# Patient Record
Sex: Female | Born: 1955 | Race: Black or African American | Hispanic: No | Smoking: Former smoker
Health system: Southern US, Community
[De-identification: ages and names within clinical notes are randomized; demographics above are authoritative.]

## PROBLEM LIST (undated history)

## (undated) DIAGNOSIS — M2540 Effusion, unspecified joint: Secondary | ICD-10-CM

## (undated) DIAGNOSIS — M79606 Pain in leg, unspecified: Secondary | ICD-10-CM

## (undated) DIAGNOSIS — R278 Other lack of coordination: Secondary | ICD-10-CM

## (undated) DIAGNOSIS — M5136 Other intervertebral disc degeneration, lumbar region: Secondary | ICD-10-CM

## (undated) DIAGNOSIS — M1990 Unspecified osteoarthritis, unspecified site: Secondary | ICD-10-CM

## (undated) DIAGNOSIS — M438X9 Other specified deforming dorsopathies, site unspecified: Secondary | ICD-10-CM

## (undated) DIAGNOSIS — M2550 Pain in unspecified joint: Secondary | ICD-10-CM

## (undated) DIAGNOSIS — M542 Cervicalgia: Secondary | ICD-10-CM

## (undated) DIAGNOSIS — R2689 Other abnormalities of gait and mobility: Secondary | ICD-10-CM

## (undated) DIAGNOSIS — T7840XA Allergy, unspecified, initial encounter: Secondary | ICD-10-CM

## (undated) DIAGNOSIS — M549 Dorsalgia, unspecified: Secondary | ICD-10-CM

## (undated) DIAGNOSIS — I10 Essential (primary) hypertension: Secondary | ICD-10-CM

## (undated) DIAGNOSIS — K2970 Gastritis, unspecified, without bleeding: Secondary | ICD-10-CM

## (undated) DIAGNOSIS — E785 Hyperlipidemia, unspecified: Secondary | ICD-10-CM

## (undated) DIAGNOSIS — M51369 Other intervertebral disc degeneration, lumbar region without mention of lumbar back pain or lower extremity pain: Secondary | ICD-10-CM

## (undated) DIAGNOSIS — M5416 Radiculopathy, lumbar region: Secondary | ICD-10-CM

## (undated) DIAGNOSIS — R011 Cardiac murmur, unspecified: Secondary | ICD-10-CM

## (undated) DIAGNOSIS — M419 Scoliosis, unspecified: Secondary | ICD-10-CM

## (undated) DIAGNOSIS — R29898 Other symptoms and signs involving the musculoskeletal system: Secondary | ICD-10-CM

## (undated) DIAGNOSIS — M7989 Other specified soft tissue disorders: Secondary | ICD-10-CM

## (undated) DIAGNOSIS — H9319 Tinnitus, unspecified ear: Secondary | ICD-10-CM

## (undated) DIAGNOSIS — M79603 Pain in arm, unspecified: Secondary | ICD-10-CM

## (undated) HISTORY — DX: Other lack of coordination: R27.8

## (undated) HISTORY — DX: Radiculopathy, lumbar region: M54.16

## (undated) HISTORY — DX: Dorsalgia, unspecified: M54.9

## (undated) HISTORY — DX: Pain in leg, unspecified: M79.606

## (undated) HISTORY — DX: Tinnitus, unspecified ear: H93.19

## (undated) HISTORY — DX: Other specified soft tissue disorders: M79.89

## (undated) HISTORY — DX: Cardiac murmur, unspecified: R01.1

## (undated) HISTORY — DX: Other intervertebral disc degeneration, lumbar region without mention of lumbar back pain or lower extremity pain: M51.369

## (undated) HISTORY — DX: Other abnormalities of gait and mobility: R26.89

## (undated) HISTORY — DX: Other symptoms and signs involving the musculoskeletal system: R29.898

## (undated) HISTORY — DX: Pain in arm, unspecified: M79.603

## (undated) HISTORY — DX: Pain in unspecified joint: M25.50

## (undated) HISTORY — DX: Allergy, unspecified, initial encounter: T78.40XA

## (undated) HISTORY — DX: Other intervertebral disc degeneration, lumbar region: M51.36

## (undated) HISTORY — DX: Other specified deforming dorsopathies, site unspecified: M43.8X9

## (undated) HISTORY — DX: Scoliosis, unspecified: M41.9

## (undated) HISTORY — DX: Gastritis, unspecified, without bleeding: K29.70

## (undated) HISTORY — PX: NECK SURGERY: SHX720

## (undated) HISTORY — PX: BACK SURGERY: SHX140

## (undated) HISTORY — DX: Effusion, unspecified joint: M25.40

## (undated) HISTORY — DX: Cervicalgia: M54.2

---

## 1998-07-17 ENCOUNTER — Emergency Department (HOSPITAL_COMMUNITY): Admission: EM | Admit: 1998-07-17 | Discharge: 1998-07-17 | Payer: Self-pay | Admitting: Emergency Medicine

## 2000-12-29 ENCOUNTER — Emergency Department (HOSPITAL_COMMUNITY): Admission: EM | Admit: 2000-12-29 | Discharge: 2000-12-29 | Payer: Self-pay | Admitting: Emergency Medicine

## 2001-04-20 ENCOUNTER — Ambulatory Visit (HOSPITAL_COMMUNITY): Admission: RE | Admit: 2001-04-20 | Discharge: 2001-04-20 | Payer: Self-pay

## 2001-04-20 ENCOUNTER — Encounter: Payer: Self-pay | Admitting: Family Medicine

## 2002-03-09 ENCOUNTER — Emergency Department (HOSPITAL_COMMUNITY): Admission: EM | Admit: 2002-03-09 | Discharge: 2002-03-09 | Payer: Self-pay | Admitting: *Deleted

## 2002-03-09 ENCOUNTER — Encounter: Payer: Self-pay | Admitting: *Deleted

## 2002-06-20 ENCOUNTER — Emergency Department (HOSPITAL_COMMUNITY): Admission: EM | Admit: 2002-06-20 | Discharge: 2002-06-20 | Payer: Self-pay | Admitting: Emergency Medicine

## 2003-08-05 ENCOUNTER — Ambulatory Visit (HOSPITAL_COMMUNITY): Admission: RE | Admit: 2003-08-05 | Discharge: 2003-08-05 | Payer: Self-pay | Admitting: Family Medicine

## 2003-08-05 ENCOUNTER — Encounter: Payer: Self-pay | Admitting: Family Medicine

## 2003-11-24 ENCOUNTER — Ambulatory Visit (HOSPITAL_COMMUNITY): Admission: RE | Admit: 2003-11-24 | Discharge: 2003-11-24 | Payer: Self-pay | Admitting: Family Medicine

## 2004-02-20 ENCOUNTER — Ambulatory Visit (HOSPITAL_COMMUNITY): Admission: RE | Admit: 2004-02-20 | Discharge: 2004-02-20 | Payer: Self-pay | Admitting: Family Medicine

## 2004-03-05 ENCOUNTER — Ambulatory Visit (HOSPITAL_COMMUNITY): Admission: RE | Admit: 2004-03-05 | Discharge: 2004-03-05 | Payer: Self-pay | Admitting: Family Medicine

## 2004-09-12 ENCOUNTER — Emergency Department (HOSPITAL_COMMUNITY): Admission: EM | Admit: 2004-09-12 | Discharge: 2004-09-12 | Payer: Self-pay | Admitting: Emergency Medicine

## 2004-12-26 ENCOUNTER — Emergency Department (HOSPITAL_COMMUNITY): Admission: EM | Admit: 2004-12-26 | Discharge: 2004-12-26 | Payer: Self-pay | Admitting: Emergency Medicine

## 2004-12-27 ENCOUNTER — Ambulatory Visit: Payer: Self-pay | Admitting: Family Medicine

## 2005-01-05 ENCOUNTER — Ambulatory Visit (HOSPITAL_COMMUNITY): Admission: RE | Admit: 2005-01-05 | Discharge: 2005-01-05 | Payer: Self-pay | Admitting: Family Medicine

## 2005-01-13 ENCOUNTER — Ambulatory Visit: Payer: Self-pay | Admitting: Orthopedic Surgery

## 2005-01-18 ENCOUNTER — Emergency Department (HOSPITAL_COMMUNITY): Admission: EM | Admit: 2005-01-18 | Discharge: 2005-01-18 | Payer: Self-pay | Admitting: Emergency Medicine

## 2005-02-02 ENCOUNTER — Ambulatory Visit: Payer: Self-pay | Admitting: Family Medicine

## 2005-02-17 ENCOUNTER — Encounter: Admission: RE | Admit: 2005-02-17 | Discharge: 2005-02-17 | Payer: Self-pay | Admitting: Neurological Surgery

## 2005-03-09 ENCOUNTER — Ambulatory Visit: Payer: Self-pay | Admitting: Family Medicine

## 2005-03-14 ENCOUNTER — Ambulatory Visit (HOSPITAL_COMMUNITY): Admission: RE | Admit: 2005-03-14 | Discharge: 2005-03-14 | Payer: Self-pay | Admitting: Family Medicine

## 2005-04-06 ENCOUNTER — Ambulatory Visit: Payer: Self-pay | Admitting: Family Medicine

## 2005-05-11 ENCOUNTER — Ambulatory Visit: Payer: Self-pay | Admitting: Family Medicine

## 2005-06-01 ENCOUNTER — Ambulatory Visit: Payer: Self-pay | Admitting: Family Medicine

## 2005-06-11 ENCOUNTER — Emergency Department (HOSPITAL_COMMUNITY): Admission: EM | Admit: 2005-06-11 | Discharge: 2005-06-11 | Payer: Self-pay | Admitting: Family Medicine

## 2005-07-15 ENCOUNTER — Emergency Department (HOSPITAL_COMMUNITY): Admission: EM | Admit: 2005-07-15 | Discharge: 2005-07-15 | Payer: Self-pay | Admitting: Family Medicine

## 2005-08-10 ENCOUNTER — Ambulatory Visit: Payer: Self-pay | Admitting: Family Medicine

## 2005-08-18 ENCOUNTER — Ambulatory Visit (HOSPITAL_BASED_OUTPATIENT_CLINIC_OR_DEPARTMENT_OTHER): Admission: RE | Admit: 2005-08-18 | Discharge: 2005-08-18 | Payer: Self-pay | Admitting: Orthopedic Surgery

## 2005-08-18 ENCOUNTER — Ambulatory Visit (HOSPITAL_COMMUNITY): Admission: RE | Admit: 2005-08-18 | Discharge: 2005-08-18 | Payer: Self-pay | Admitting: Orthopedic Surgery

## 2005-08-26 ENCOUNTER — Encounter: Admission: RE | Admit: 2005-08-26 | Discharge: 2005-10-18 | Payer: Self-pay | Admitting: Orthopedic Surgery

## 2005-10-29 ENCOUNTER — Emergency Department (HOSPITAL_COMMUNITY): Admission: EM | Admit: 2005-10-29 | Discharge: 2005-10-29 | Payer: Self-pay | Admitting: Family Medicine

## 2006-01-11 ENCOUNTER — Ambulatory Visit (HOSPITAL_COMMUNITY): Admission: RE | Admit: 2006-01-11 | Discharge: 2006-01-11 | Payer: Self-pay | Admitting: Orthopaedic Surgery

## 2006-02-07 ENCOUNTER — Ambulatory Visit: Payer: Self-pay | Admitting: Family Medicine

## 2006-04-07 ENCOUNTER — Emergency Department (HOSPITAL_COMMUNITY): Admission: EM | Admit: 2006-04-07 | Discharge: 2006-04-07 | Payer: Self-pay | Admitting: Emergency Medicine

## 2006-06-12 ENCOUNTER — Encounter: Payer: Self-pay | Admitting: Family Medicine

## 2006-06-12 ENCOUNTER — Other Ambulatory Visit: Admission: RE | Admit: 2006-06-12 | Discharge: 2006-06-12 | Payer: Self-pay | Admitting: Family Medicine

## 2006-06-12 ENCOUNTER — Ambulatory Visit: Payer: Self-pay | Admitting: Family Medicine

## 2006-07-19 ENCOUNTER — Ambulatory Visit: Payer: Self-pay | Admitting: Family Medicine

## 2006-08-23 ENCOUNTER — Ambulatory Visit: Payer: Self-pay | Admitting: Family Medicine

## 2006-10-24 ENCOUNTER — Ambulatory Visit: Payer: Self-pay | Admitting: Family Medicine

## 2006-10-25 ENCOUNTER — Ambulatory Visit (HOSPITAL_COMMUNITY): Admission: RE | Admit: 2006-10-25 | Discharge: 2006-10-25 | Payer: Self-pay | Admitting: Family Medicine

## 2006-10-31 ENCOUNTER — Encounter (HOSPITAL_COMMUNITY): Admission: RE | Admit: 2006-10-31 | Discharge: 2006-11-20 | Payer: Self-pay | Admitting: Family Medicine

## 2006-11-27 ENCOUNTER — Ambulatory Visit: Payer: Self-pay | Admitting: Family Medicine

## 2006-11-30 ENCOUNTER — Ambulatory Visit (HOSPITAL_COMMUNITY): Admission: RE | Admit: 2006-11-30 | Discharge: 2006-11-30 | Payer: Self-pay | Admitting: Family Medicine

## 2006-12-28 ENCOUNTER — Ambulatory Visit: Payer: Self-pay | Admitting: Family Medicine

## 2007-01-04 ENCOUNTER — Encounter: Payer: Self-pay | Admitting: Family Medicine

## 2007-01-04 LAB — CONVERTED CEMR LAB
HDL: 56 mg/dL (ref >39)
LDL Cholesterol: 169 mg/dL — ABNORMAL HIGH (ref 0–99)
Total CHOL/HDL Ratio: 4.4

## 2007-02-08 ENCOUNTER — Ambulatory Visit: Payer: Self-pay | Admitting: Family Medicine

## 2007-02-27 ENCOUNTER — Ambulatory Visit: Payer: Self-pay | Admitting: Family Medicine

## 2007-04-15 ENCOUNTER — Emergency Department (HOSPITAL_COMMUNITY): Admission: EM | Admit: 2007-04-15 | Discharge: 2007-04-15 | Payer: Self-pay | Admitting: Emergency Medicine

## 2007-06-12 ENCOUNTER — Ambulatory Visit (HOSPITAL_COMMUNITY): Admission: RE | Admit: 2007-06-12 | Discharge: 2007-06-12 | Payer: Self-pay | Admitting: Orthopaedic Surgery

## 2007-06-14 ENCOUNTER — Ambulatory Visit: Payer: Self-pay | Admitting: Family Medicine

## 2007-06-14 ENCOUNTER — Other Ambulatory Visit: Admission: RE | Admit: 2007-06-14 | Discharge: 2007-06-14 | Payer: Self-pay | Admitting: Family Medicine

## 2007-06-14 ENCOUNTER — Encounter: Payer: Self-pay | Admitting: Family Medicine

## 2007-06-14 LAB — CONVERTED CEMR LAB
Albumin: 4.2 g/dL (ref 3.5–5.2)
Bilirubin, Direct: 0.1 mg/dL (ref 0.0–0.3)
Chlamydia, DNA Probe: NEGATIVE
GC Probe Amp, Genital: NEGATIVE
Total Bilirubin: 0.7 mg/dL (ref 0.3–1.2)

## 2007-06-15 ENCOUNTER — Encounter: Payer: Self-pay | Admitting: Family Medicine

## 2007-06-15 LAB — CONVERTED CEMR LAB
Candida species: NEGATIVE
Gardnerella vaginalis: POSITIVE — AB
Trichomonal Vaginitis: NEGATIVE

## 2007-07-17 ENCOUNTER — Ambulatory Visit: Payer: Self-pay | Admitting: Family Medicine

## 2007-10-02 ENCOUNTER — Ambulatory Visit: Payer: Self-pay | Admitting: Family Medicine

## 2007-10-26 ENCOUNTER — Ambulatory Visit: Payer: Self-pay | Admitting: Family Medicine

## 2007-12-11 ENCOUNTER — Encounter: Payer: Self-pay | Admitting: Family Medicine

## 2007-12-11 DIAGNOSIS — M549 Dorsalgia, unspecified: Secondary | ICD-10-CM | POA: Insufficient documentation

## 2007-12-11 DIAGNOSIS — F329 Major depressive disorder, single episode, unspecified: Secondary | ICD-10-CM | POA: Insufficient documentation

## 2007-12-19 ENCOUNTER — Ambulatory Visit: Payer: Self-pay | Admitting: Family Medicine

## 2008-01-31 ENCOUNTER — Encounter: Payer: Self-pay | Admitting: Family Medicine

## 2008-01-31 ENCOUNTER — Ambulatory Visit: Payer: Self-pay | Admitting: Family Medicine

## 2008-01-31 DIAGNOSIS — E785 Hyperlipidemia, unspecified: Secondary | ICD-10-CM | POA: Insufficient documentation

## 2008-01-31 DIAGNOSIS — J309 Allergic rhinitis, unspecified: Secondary | ICD-10-CM | POA: Insufficient documentation

## 2008-01-31 DIAGNOSIS — I10 Essential (primary) hypertension: Secondary | ICD-10-CM | POA: Insufficient documentation

## 2008-02-01 ENCOUNTER — Encounter: Payer: Self-pay | Admitting: Family Medicine

## 2008-02-01 LAB — CONVERTED CEMR LAB
GC Probe Amp, Genital: NEGATIVE
Gardnerella vaginalis: POSITIVE — AB

## 2008-02-05 ENCOUNTER — Ambulatory Visit (HOSPITAL_COMMUNITY): Admission: RE | Admit: 2008-02-05 | Discharge: 2008-02-05 | Payer: Self-pay | Admitting: Family Medicine

## 2008-02-11 ENCOUNTER — Ambulatory Visit: Payer: Self-pay | Admitting: Family Medicine

## 2008-03-13 ENCOUNTER — Ambulatory Visit: Payer: Self-pay | Admitting: Family Medicine

## 2008-04-03 ENCOUNTER — Ambulatory Visit: Payer: Self-pay | Admitting: Family Medicine

## 2008-04-03 LAB — CONVERTED CEMR LAB: Helicobacter Pylori Antibody-IgG: 2.3 — ABNORMAL HIGH

## 2008-04-10 ENCOUNTER — Ambulatory Visit (HOSPITAL_COMMUNITY): Admission: RE | Admit: 2008-04-10 | Discharge: 2008-04-10 | Payer: Self-pay | Admitting: Family Medicine

## 2008-04-11 ENCOUNTER — Ambulatory Visit (HOSPITAL_COMMUNITY): Admission: RE | Admit: 2008-04-11 | Discharge: 2008-04-11 | Payer: Self-pay | Admitting: Family Medicine

## 2008-04-25 ENCOUNTER — Encounter (HOSPITAL_COMMUNITY): Admission: RE | Admit: 2008-04-25 | Discharge: 2008-05-25 | Payer: Self-pay | Admitting: Family Medicine

## 2008-05-08 ENCOUNTER — Ambulatory Visit: Payer: Self-pay | Admitting: Family Medicine

## 2008-05-22 ENCOUNTER — Encounter: Admission: RE | Admit: 2008-05-22 | Discharge: 2008-05-22 | Payer: Self-pay | Admitting: Family Medicine

## 2008-07-09 ENCOUNTER — Encounter: Payer: Self-pay | Admitting: Family Medicine

## 2008-07-29 ENCOUNTER — Telehealth: Payer: Self-pay | Admitting: Family Medicine

## 2008-08-04 ENCOUNTER — Encounter: Payer: Self-pay | Admitting: Family Medicine

## 2008-09-08 ENCOUNTER — Ambulatory Visit: Payer: Self-pay | Admitting: Family Medicine

## 2008-10-13 ENCOUNTER — Encounter: Payer: Self-pay | Admitting: Family Medicine

## 2008-11-11 ENCOUNTER — Telehealth (INDEPENDENT_AMBULATORY_CARE_PROVIDER_SITE_OTHER): Payer: Self-pay | Admitting: Internal Medicine

## 2008-11-18 ENCOUNTER — Telehealth: Payer: Self-pay | Admitting: Family Medicine

## 2008-11-19 ENCOUNTER — Encounter: Payer: Self-pay | Admitting: Family Medicine

## 2008-11-19 ENCOUNTER — Telehealth: Payer: Self-pay | Admitting: Family Medicine

## 2008-12-10 ENCOUNTER — Ambulatory Visit: Payer: Self-pay | Admitting: Family Medicine

## 2008-12-10 DIAGNOSIS — R5383 Other fatigue: Secondary | ICD-10-CM | POA: Insufficient documentation

## 2008-12-10 DIAGNOSIS — R5381 Other malaise: Secondary | ICD-10-CM

## 2008-12-22 ENCOUNTER — Telehealth: Payer: Self-pay | Admitting: Family Medicine

## 2009-01-23 ENCOUNTER — Encounter (INDEPENDENT_AMBULATORY_CARE_PROVIDER_SITE_OTHER): Payer: Self-pay | Admitting: *Deleted

## 2009-02-12 ENCOUNTER — Ambulatory Visit: Payer: Self-pay | Admitting: Family Medicine

## 2009-02-12 LAB — CONVERTED CEMR LAB
Glucose, Urine, Semiquant: NEGATIVE
Nitrite: NEGATIVE
Specific Gravity, Urine: 1.015
pH: 8.5

## 2009-02-17 ENCOUNTER — Encounter: Payer: Self-pay | Admitting: Family Medicine

## 2009-02-19 ENCOUNTER — Encounter: Payer: Self-pay | Admitting: Family Medicine

## 2009-02-19 LAB — CONVERTED CEMR LAB
ALT: 10 units/L (ref 0–35)
Basophils Absolute: 0.1 10*3/uL (ref 0.0–0.1)
Chloride: 105 meq/L (ref 96–112)
Cholesterol: 249 mg/dL — ABNORMAL HIGH (ref 0–200)
Glucose, Bld: 92 mg/dL (ref 70–99)
Hemoglobin: 14.8 g/dL (ref 12.0–15.0)
Lymphocytes Relative: 50 % — ABNORMAL HIGH (ref 12–46)
Lymphs Abs: 3.2 10*3/uL (ref 0.7–4.0)
Monocytes Absolute: 0.5 10*3/uL (ref 0.1–1.0)
Neutro Abs: 2.5 10*3/uL (ref 1.7–7.7)
Potassium: 3.8 meq/L (ref 3.5–5.3)
RDW: 15.6 % — ABNORMAL HIGH (ref 11.5–15.5)
Sodium: 139 meq/L (ref 135–145)
TSH: 0.995 microintl units/mL (ref 0.350–4.500)
Total CHOL/HDL Ratio: 4.5
Total Protein: 8.5 g/dL — ABNORMAL HIGH (ref 6.0–8.3)
Triglycerides: 95 mg/dL (ref <150)
VLDL: 19 mg/dL (ref 0–40)
WBC: 6.5 10*3/uL (ref 4.0–10.5)

## 2009-03-17 IMAGING — NM NM HEPATO W/GB/PHARM/[PERSON_NAME]
2 series · 12 of 12 positions shown · non-contrast
Comparison: Ultrasound there is ultrasound 04/11/2008

CLINICAL DATA: Nominal pain and bloating

NUCLEAR MEDICINE HEPATOBILIARY IMAGING WITH GALLBLADDER EF
TECHNIQUE: Sequential images of the abdomen were obtained [DATE] minutes following intravenous administration of
radiopharmaceutical. After oral ingestion of 8oz of half and half
cream, gallbladder ejection fraction was determined.
Radiopharmaceutical:  Y.BmYi Oc-CCm Choletec

[Series 1: hepatobiliary · 3.20mm/px · 6 of 60 frames shown (1 of 2)]
[frame 6/60]
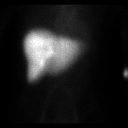
[frame 16/60]
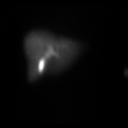
[frame 26/60]
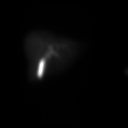
[frame 36/60]
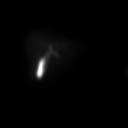
[frame 46/60]
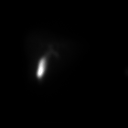
[frame 56/60]
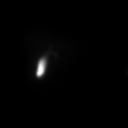

[Series 1: hepatobiliary · 3.20mm/px · 6 of 60 frames shown (2 of 2)]
[frame 6/60]
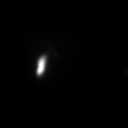
[frame 16/60]
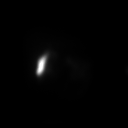
[frame 26/60]
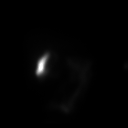
[frame 36/60]
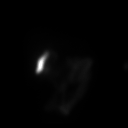
[frame 46/60]
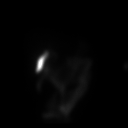
[frame 56/60]
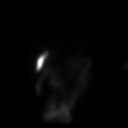

[12 of 12 positions shown; findings below may reference images not displayed]

FINDINGS: There is prompt extraction of radiotracer from the blood
pool homogeneous uptake in the liver.  The gallbladder begins to
fill by 15 minutes. At 60 minutes, a fatty meal was provided
orally.  The gallbladder contracted appropriately with a calculated
ejection fraction equal 63 % at 60 minutes (normal greater than
50%).
IMPRESSION: 1..  Normal gallbladder ejection fraction =  63%.
2..  Patent cystic and common bile duct.

## 2009-03-20 ENCOUNTER — Ambulatory Visit: Payer: Self-pay | Admitting: Gastroenterology

## 2009-03-20 DIAGNOSIS — K5900 Constipation, unspecified: Secondary | ICD-10-CM | POA: Insufficient documentation

## 2009-03-20 DIAGNOSIS — A048 Other specified bacterial intestinal infections: Secondary | ICD-10-CM | POA: Insufficient documentation

## 2009-03-23 ENCOUNTER — Telehealth: Payer: Self-pay | Admitting: Family Medicine

## 2009-03-23 ENCOUNTER — Encounter: Payer: Self-pay | Admitting: Gastroenterology

## 2009-03-30 ENCOUNTER — Ambulatory Visit: Payer: Self-pay | Admitting: Gastroenterology

## 2009-03-30 ENCOUNTER — Encounter: Payer: Self-pay | Admitting: Gastroenterology

## 2009-03-30 ENCOUNTER — Ambulatory Visit (HOSPITAL_COMMUNITY): Admission: RE | Admit: 2009-03-30 | Discharge: 2009-03-30 | Payer: Self-pay | Admitting: Gastroenterology

## 2009-03-30 HISTORY — PX: COLONOSCOPY: SHX5424

## 2009-04-01 ENCOUNTER — Encounter: Payer: Self-pay | Admitting: Gastroenterology

## 2009-04-09 ENCOUNTER — Telehealth: Payer: Self-pay | Admitting: Gastroenterology

## 2009-04-16 ENCOUNTER — Encounter (INDEPENDENT_AMBULATORY_CARE_PROVIDER_SITE_OTHER): Payer: Self-pay

## 2009-05-01 ENCOUNTER — Ambulatory Visit: Payer: Self-pay | Admitting: Gastroenterology

## 2009-05-01 DIAGNOSIS — K5730 Diverticulosis of large intestine without perforation or abscess without bleeding: Secondary | ICD-10-CM | POA: Insufficient documentation

## 2009-05-01 DIAGNOSIS — D126 Benign neoplasm of colon, unspecified: Secondary | ICD-10-CM | POA: Insufficient documentation

## 2009-05-07 ENCOUNTER — Encounter: Payer: Self-pay | Admitting: Gastroenterology

## 2009-05-07 DIAGNOSIS — R799 Abnormal finding of blood chemistry, unspecified: Secondary | ICD-10-CM | POA: Insufficient documentation

## 2009-05-07 LAB — CONVERTED CEMR LAB
Basophils Absolute: 0 10*3/uL (ref 0.0–0.1)
Basophils Relative: 0 % (ref 0–1)
Eosinophils Absolute: 0.2 10*3/uL (ref 0.0–0.7)
Eosinophils Relative: 2 % (ref 0–5)
HCT: 42.1 % (ref 36.0–46.0)
Hemoglobin, Urine: NEGATIVE
Hemoglobin: 13.8 g/dL (ref 12.0–15.0)
Ketones, ur: 15 mg/dL — AB
Leukocytes, UA: NEGATIVE
Lymphocytes Relative: 46 % (ref 12–46)
Lymphs Abs: 3.9 10*3/uL (ref 0.7–4.0)
MCHC: 32.8 g/dL (ref 30.0–36.0)
MCV: 91.1 fL (ref 78.0–100.0)
Monocytes Absolute: 0.5 10*3/uL (ref 0.1–1.0)
Monocytes Relative: 6 % (ref 3–12)
Neutro Abs: 3.9 10*3/uL (ref 1.7–7.7)
Neutrophils Relative %: 46 % (ref 43–77)
Nitrite: NEGATIVE
Platelets: 267 10*3/uL (ref 150–400)
Protein, ur: NEGATIVE mg/dL
RBC: 4.62 M/uL (ref 3.87–5.11)
RDW: 14.5 % (ref 11.5–15.5)
Specific Gravity, Urine: 1.022 (ref 1.005–1.030)
Urine Glucose: NEGATIVE mg/dL
Urobilinogen, UA: 1 (ref 0.0–1.0)
WBC: 8.6 10*3/uL (ref 4.0–10.5)
pH: 6 (ref 5.0–8.0)

## 2009-05-18 LAB — CONVERTED CEMR LAB
ALT: 8 units/L (ref 0–35)
AST: 11 units/L (ref 0–37)
Albumin: 3.7 g/dL (ref 3.5–5.2)
Alkaline Phosphatase: 65 units/L (ref 39–117)
BUN: 11 mg/dL (ref 6–23)
Bilirubin, Direct: <0.1 mg/dL (ref 0.0–0.3)
CO2: 25 meq/L (ref 19–32)
Calcium: 9 mg/dL (ref 8.4–10.5)
Chloride: 109 meq/L (ref 96–112)
Creatinine, Ser: 0.76 mg/dL (ref 0.40–1.20)
Glucose, Bld: 90 mg/dL (ref 70–99)
Potassium: 3.9 meq/L (ref 3.5–5.3)
Sodium: 143 meq/L (ref 135–145)
Total Bilirubin: 0.4 mg/dL (ref 0.3–1.2)
Total Protein: 7.1 g/dL (ref 6.0–8.3)

## 2009-06-04 ENCOUNTER — Encounter: Payer: Self-pay | Admitting: Gastroenterology

## 2009-06-22 ENCOUNTER — Telehealth: Payer: Self-pay | Admitting: Family Medicine

## 2009-06-23 ENCOUNTER — Ambulatory Visit: Payer: Self-pay | Admitting: Family Medicine

## 2009-06-24 ENCOUNTER — Encounter (INDEPENDENT_AMBULATORY_CARE_PROVIDER_SITE_OTHER): Payer: Self-pay | Admitting: *Deleted

## 2009-07-29 ENCOUNTER — Ambulatory Visit: Payer: Self-pay | Admitting: Gastroenterology

## 2009-07-30 ENCOUNTER — Encounter: Payer: Self-pay | Admitting: Family Medicine

## 2009-08-06 ENCOUNTER — Ambulatory Visit: Payer: Self-pay | Admitting: Family Medicine

## 2009-08-14 ENCOUNTER — Encounter: Payer: Self-pay | Admitting: Family Medicine

## 2009-08-14 ENCOUNTER — Ambulatory Visit (HOSPITAL_COMMUNITY): Admission: RE | Admit: 2009-08-14 | Discharge: 2009-08-15 | Payer: Self-pay | Admitting: Orthopaedic Surgery

## 2009-08-17 ENCOUNTER — Telehealth: Payer: Self-pay | Admitting: Family Medicine

## 2009-09-28 ENCOUNTER — Encounter: Payer: Self-pay | Admitting: Family Medicine

## 2009-10-19 ENCOUNTER — Ambulatory Visit: Payer: Self-pay | Admitting: Family Medicine

## 2009-11-10 ENCOUNTER — Encounter (INDEPENDENT_AMBULATORY_CARE_PROVIDER_SITE_OTHER): Payer: Self-pay | Admitting: *Deleted

## 2009-11-19 ENCOUNTER — Emergency Department (HOSPITAL_COMMUNITY): Admission: EM | Admit: 2009-11-19 | Discharge: 2009-11-19 | Payer: Self-pay | Admitting: Emergency Medicine

## 2009-11-27 ENCOUNTER — Encounter: Payer: Self-pay | Admitting: Family Medicine

## 2009-11-30 ENCOUNTER — Telehealth: Payer: Self-pay | Admitting: Family Medicine

## 2009-11-30 ENCOUNTER — Ambulatory Visit: Payer: Self-pay | Admitting: Family Medicine

## 2009-11-30 DIAGNOSIS — M810 Age-related osteoporosis without current pathological fracture: Secondary | ICD-10-CM | POA: Insufficient documentation

## 2009-12-09 ENCOUNTER — Encounter: Payer: Self-pay | Admitting: Family Medicine

## 2009-12-09 ENCOUNTER — Telehealth: Payer: Self-pay | Admitting: Family Medicine

## 2009-12-10 LAB — CONVERTED CEMR LAB
BUN: 11 mg/dL (ref 6–23)
Bilirubin, Direct: 0.1 mg/dL (ref 0.0–0.3)
Calcium: 10 mg/dL (ref 8.4–10.5)
Creatinine, Ser: 0.79 mg/dL (ref 0.40–1.20)
HDL: 55 mg/dL (ref >39)
LDL Cholesterol: 200 mg/dL — ABNORMAL HIGH (ref 0–99)
Potassium: 5.9 meq/L — ABNORMAL HIGH (ref 3.5–5.3)
Total Bilirubin: 0.7 mg/dL (ref 0.3–1.2)
Triglycerides: 127 mg/dL (ref <150)
VLDL: 25 mg/dL (ref 0–40)

## 2009-12-14 ENCOUNTER — Ambulatory Visit (HOSPITAL_COMMUNITY): Admission: RE | Admit: 2009-12-14 | Discharge: 2009-12-14 | Payer: Self-pay | Admitting: Family Medicine

## 2009-12-15 LAB — CONVERTED CEMR LAB: Potassium: 3.8 meq/L (ref 3.5–5.3)

## 2010-01-12 ENCOUNTER — Encounter (INDEPENDENT_AMBULATORY_CARE_PROVIDER_SITE_OTHER): Payer: Self-pay | Admitting: *Deleted

## 2010-01-13 ENCOUNTER — Telehealth: Payer: Self-pay | Admitting: Family Medicine

## 2010-01-14 ENCOUNTER — Ambulatory Visit: Payer: Self-pay | Admitting: Family Medicine

## 2010-02-01 ENCOUNTER — Telehealth: Payer: Self-pay | Admitting: Family Medicine

## 2010-02-15 ENCOUNTER — Ambulatory Visit: Payer: Self-pay | Admitting: Family Medicine

## 2010-02-15 DIAGNOSIS — M542 Cervicalgia: Secondary | ICD-10-CM | POA: Insufficient documentation

## 2010-02-18 ENCOUNTER — Ambulatory Visit: Payer: Self-pay | Admitting: Family Medicine

## 2010-03-01 ENCOUNTER — Ambulatory Visit: Payer: Self-pay | Admitting: Family Medicine

## 2010-03-01 ENCOUNTER — Other Ambulatory Visit: Admission: RE | Admit: 2010-03-01 | Discharge: 2010-03-01 | Payer: Self-pay | Admitting: Family Medicine

## 2010-03-01 LAB — CONVERTED CEMR LAB
OCCULT 1: NEGATIVE
Pap Smear: NEGATIVE

## 2010-03-15 ENCOUNTER — Telehealth: Payer: Self-pay | Admitting: Family Medicine

## 2010-03-18 ENCOUNTER — Encounter: Payer: Self-pay | Admitting: Family Medicine

## 2010-03-23 ENCOUNTER — Encounter: Payer: Self-pay | Admitting: Family Medicine

## 2010-04-08 ENCOUNTER — Ambulatory Visit: Payer: Self-pay | Admitting: Family Medicine

## 2010-04-13 ENCOUNTER — Telehealth: Payer: Self-pay | Admitting: Family Medicine

## 2010-04-13 ENCOUNTER — Encounter: Payer: Self-pay | Admitting: Family Medicine

## 2010-04-14 ENCOUNTER — Telehealth: Payer: Self-pay | Admitting: Family Medicine

## 2010-04-15 LAB — CONVERTED CEMR LAB
AST: 14 units/L (ref 0–37)
Albumin: 4 g/dL (ref 3.5–5.2)
CO2: 27 meq/L (ref 19–32)
Calcium: 9.6 mg/dL (ref 8.4–10.5)
Chloride: 107 meq/L (ref 96–112)
HDL: 53 mg/dL (ref >39)
Lymphs Abs: 3.2 10*3/uL (ref 0.7–4.0)
Monocytes Relative: 7 % (ref 3–12)
Neutro Abs: 3.9 10*3/uL (ref 1.7–7.7)
Neutrophils Relative %: 50 % (ref 43–77)
RBC: 4.44 M/uL (ref 3.87–5.11)
Sodium: 142 meq/L (ref 135–145)
TSH: 0.452 microintl units/mL (ref 0.350–4.500)
Total Bilirubin: 0.7 mg/dL (ref 0.3–1.2)
Total CHOL/HDL Ratio: 4.4
Total Protein: 7.1 g/dL (ref 6.0–8.3)
Triglycerides: 82 mg/dL (ref <150)
WBC: 7.8 10*3/uL (ref 4.0–10.5)

## 2010-06-14 ENCOUNTER — Telehealth: Payer: Self-pay | Admitting: Family Medicine

## 2010-07-13 ENCOUNTER — Ambulatory Visit: Payer: Self-pay | Admitting: Family Medicine

## 2010-07-15 ENCOUNTER — Telehealth: Payer: Self-pay | Admitting: Family Medicine

## 2010-08-30 ENCOUNTER — Ambulatory Visit: Payer: Self-pay | Admitting: Family Medicine

## 2010-09-15 ENCOUNTER — Telehealth: Payer: Self-pay | Admitting: Family Medicine

## 2010-09-21 ENCOUNTER — Telehealth: Payer: Self-pay | Admitting: Family Medicine

## 2010-09-28 ENCOUNTER — Telehealth: Payer: Self-pay | Admitting: Family Medicine

## 2010-10-05 ENCOUNTER — Emergency Department (HOSPITAL_COMMUNITY): Admission: EM | Admit: 2010-10-05 | Discharge: 2010-10-05 | Payer: Self-pay | Admitting: Emergency Medicine

## 2010-10-05 ENCOUNTER — Telehealth: Payer: Self-pay | Admitting: Family Medicine

## 2010-10-13 ENCOUNTER — Encounter: Payer: Self-pay | Admitting: Family Medicine

## 2010-10-22 ENCOUNTER — Encounter: Payer: Self-pay | Admitting: Family Medicine

## 2010-10-26 ENCOUNTER — Telehealth (INDEPENDENT_AMBULATORY_CARE_PROVIDER_SITE_OTHER): Payer: Self-pay | Admitting: *Deleted

## 2010-11-10 ENCOUNTER — Ambulatory Visit: Payer: Self-pay | Admitting: Family Medicine

## 2010-11-10 DIAGNOSIS — H6690 Otitis media, unspecified, unspecified ear: Secondary | ICD-10-CM | POA: Insufficient documentation

## 2010-12-03 ENCOUNTER — Encounter: Payer: Self-pay | Admitting: Family Medicine

## 2010-12-06 ENCOUNTER — Telehealth (INDEPENDENT_AMBULATORY_CARE_PROVIDER_SITE_OTHER): Payer: Self-pay | Admitting: *Deleted

## 2010-12-11 ENCOUNTER — Encounter: Payer: Self-pay | Admitting: Family Medicine

## 2010-12-12 ENCOUNTER — Encounter: Payer: Self-pay | Admitting: Family Medicine

## 2010-12-13 ENCOUNTER — Encounter: Payer: Self-pay | Admitting: Family Medicine

## 2010-12-21 NOTE — Letter (Signed)
Summary: ccme  ccme   Imported By: Lind Guest 03/18/2010 14:48:51  _____________________________________________________________________  External Attachment:    Type:   Image     Comment:   External Document

## 2010-12-21 NOTE — Progress Notes (Signed)
Summary: call  Phone Note Call from Patient   Summary of Call: she recieved your letter and wants you to call her back at [phone] or  [phone] Initial call taken by: Lind Guest,  Apr 14, 2010 10:06 AM  Follow-up for Phone Call        advised patient of lab results, sent refill requested Follow-up by: Adella Hare LPN,  Apr 15, 2010 2:24 PM    Prescriptions: VITAMIN D (ERGOCALCIFEROL) 50000 UNIT CAPS (ERGOCALCIFEROL) one tab by mouth every week  #4 x 3   Entered by:   Adella Hare LPN   Authorized by:   Syliva Overman MD   Signed by:   Adella Hare LPN on 63/87/5643   Method used:   Electronically to        [facility] (retail)       [address] [place], [place]  [address]       Ph: [phone]       Fax: [phone]   RxID:   [id number]

## 2010-12-21 NOTE — Letter (Signed)
Summary: Scheduled Appointment  [facility]  [address]   [place], [place] [address]   Phone: [phone]  Fax: [phone]    January 12, 2010   Dear: Lori Singh            DOB: 1956-01-06    I have been instructed to schedule you an appointment in our office.  Your appointment is as follows:   Date: February 03, 2010  Time: 4:00pm     Please be here 15 minutes early.   Provider: _Dr Darrick Penna    Please contact the office if you need to reschedule this appointment for a more convenient time.   Thank you,    [facility] Ph: [phone]   Fax: [phone]

## 2010-12-21 NOTE — Progress Notes (Signed)
Summary: [facility] BONE DENISTY  Phone Note Call from Patient   Summary of Call: [facility] HER BONE DENISTY AND WANTS TO WAIT ON HER MAMMOGRAM BECAUSE OF HER BACK Initial call taken by: Lind Guest,  December 09, 2009 4:02 PM  Follow-up for Phone Call        pls do and let her know Follow-up by: Syliva Overman MD,  December 09, 2009 5:12 PM  Additional Follow-up for Phone Call Additional follow up Details #1::        Luann rescheduled her bone density and it is for 12/14/2009. pt notified  Additional Follow-up by: Rudene Anda,  December 10, 2009 8:28 AM

## 2010-12-21 NOTE — Letter (Signed)
Summary: discharge letter  discharge letter   Imported By: Curtis Sites 10/22/2010 12:14:45  _____________________________________________________________________  External Attachment:    Type:   Image     Comment:   External Document

## 2010-12-21 NOTE — Progress Notes (Signed)
Summary: refills  Phone Note Call from Patient   Summary of Call: needs a refill on hydrocodine and allegry meds. taking mom to hospice today and would like to get xanax [phone] Initial call taken by: Rudene Anda,  June 14, 2010 10:14 AM  Follow-up for Phone Call        no allergy meds on file, others sent to pharmacy Follow-up by: Adella Hare LPN,  June 14, 2010 10:18 AM    Prescriptions: HYDROCODONE-ACETAMINOPHEN 10-325 MG TABS (HYDROCODONE-ACETAMINOPHEN) one tablet 4 times daily as needed for back pain.  Not to exceed 4 in 24 hrs.  #120 x 0   Entered by:   Adella Hare LPN   Authorized by:   Syliva Overman MD   Signed by:   Adella Hare LPN on 09/81/1914   Method used:   Printed then faxed to ...       [facility] (retail)       [address]       [place], [place]  [address]       Ph: [phone]       Fax: [phone]   RxID:   [id number] ALPRAZOLAM 0.25 MG TABS (ALPRAZOLAM) Take 1 tab by mouth at bedtime  #30 x 0   Entered by:   Adella Hare LPN   Authorized by:   Syliva Overman MD   Signed by:   Adella Hare LPN on 40/34/7425   Method used:   Printed then faxed to ...       [facility] (retail)       [address]       [place], [place]  [address]       Ph: [phone]       Fax: [phone]   RxID:   [id number]

## 2010-12-21 NOTE — Progress Notes (Signed)
Summary: RECEIVED LETTER  Phone Note Call from Patient   Summary of Call: SHE HAS RECEIVED HER LETTER ABOUT BEING DISCHARGED AND IS DISAPPOINTED BECAUSE SHE HAS BEEN WITH DR. Lodema Hong FOREVER AND DR. Lodema Hong UNDERSTANDS HER AND AT THIS POINT IN LIFE WOULD BE HARD TO FIND ANOTHER LIKE DR Ciro Backer CALL HER AT [phone] Initial call taken by: Lind Guest,  October 26, 2010 2:33 PM  Follow-up for Phone Call        Called Ms. Priest 10/26/10 @ 4:50 PM.  She asked if we would reconsider discharging her from the practice and I advised that was not possible.  She stated her son was in federal prison and she forgot to call but this was not the first NO SHOW for patient.  We did not discuss her episodes of using profanity with the nurse and the decision to discharge her based on Dr. Anthony Sar recommendations.  She asked that she be allowed to pick up her records and will come in to sign a ROI form and we will work with [employer] to make that happen.  Advisd there may be some expense involved.   Follow-up by: Doneen Poisson

## 2010-12-21 NOTE — Progress Notes (Signed)
Summary: RX  Phone Note Call from Patient   Summary of Call: NEEDS HER HYDROCODONE REFILLED EVERYTHING WAS READY BUT THAT 1   [place] [facility] Initial call taken by: Lind Guest,  July 15, 2010 11:01 AM    Prescriptions: HYDROCODONE-ACETAMINOPHEN 10-325 MG TABS (HYDROCODONE-ACETAMINOPHEN) one tablet 4 times daily as needed for back pain.  Not to exceed 4 in 24 hrs.  #120 x 1   Entered by:   Everitt Amber LPN   Authorized by:   Syliva Overman MD   Signed by:   Everitt Amber LPN on 52/84/1324   Method used:   Printed then faxed to ...       [facility] (retail)       [address]       [place], [place]  [address]       Ph: [phone]       Fax: [phone]   RxID:   [id number]

## 2010-12-21 NOTE — Progress Notes (Signed)
Summary: medicine /  cold  Phone Note Call from Patient   Summary of Call: runny nose  cough drain  fling that seems to be stuck on back of throat     no fever chills  wanst something called in [place] apot call back [phone] Initial call taken by: Lind Guest,  September 21, 2010 11:31 AM  Follow-up for Phone Call        advise meds not covered by ins, but but tess perles shoukld cost $4 Follow-up by: Syliva Overman MD,  September 21, 2010 12:49 PM  Additional Follow-up for Phone Call Additional follow up Details #1::        patient aware Additional Follow-up by: Adella Hare LPN,  September 21, 2010 1:45 PM    New/Updated Medications: TESSALON PERLES 100 MG CAPS (BENZONATATE) Take 1 capsule by mouth three times a day Prescriptions: TESSALON PERLES 100 MG CAPS (BENZONATATE) Take 1 capsule by mouth three times a day  #21 x 0   Entered and Authorized by:   Syliva Overman MD   Signed by:   Syliva Overman MD on 09/21/2010   Method used:   Electronically to        [facility] (retail)       [address]       [place], [place]  [address]       Ph: [phone]       Fax: [phone]   RxID:   [id number]

## 2010-12-21 NOTE — Assessment & Plan Note (Signed)
Summary: PHY   Vital Signs:  Patient profile:   55 year old female Menstrual status:  postmenopausal Height:      62 inches Weight:      115 pounds BMI:     21.11 O2 Sat:      98 % Pulse rate:   87 / minute Pulse rhythm:   regular Resp:     16 per minute BP sitting:   164 / 76  (left arm) Cuff size:   regular  Vitals Entered By: Everitt Amber LPN (March 01, 2010 1:43 PM) CC: 7 lb weight loss in a week since starting the Maxzide  Vision Screening:Right eye with correction: 20 / 25 Both eyes with correction: 20 / 20  Color vision testing: normal     Vision Comments: states she can't see good with her left eye due to cataracts  Vision Entered By: Everitt Amber LPN (March 01, 2010 1:46 PM)   Primary Care Provider:  Dr. Lodema Hong  CC:  7 lb weight loss in a week since starting the Maxzide.  History of Present Illness: Pt reports she hjas not been doing well. She is overwhelmed ioth her personal ealth, chronic back pain, an d that of her ailing mother's . She still has not heard from menta;l health or physical therapy thouh both referrals are over 64 weeks old. Sbhe denies any recent fever or chills. Her nicotine use has increased as has her level of stress.  Preventive Screening-Counseling & Management  Alcohol-Tobacco     Smoking Cessation Counseling: yes  Current Medications (verified): 1)  Clonidine Hcl 0.3 Mg  Tabs (Clonidine Hcl) .... One and One Half Tab By Mouth Once Daily 2)  Nitroquick 0.4 Mg  Subl (Nitroglycerin) .... One Tab Under Tounge As Needed (Uad) 3)  Prilosec 20 Mg Cpdr (Omeprazole) .Marland Kitchen.. 1 By Mouth First Thing in The Morning and 30 Minutes Before Supper 4)  Alprazolam 0.25 Mg Tabs (Alprazolam) .... Take 1 Tab By Mouth At Bedtime 5)  Hydrocodone-Acetaminophen 10-325 Mg Tabs (Hydrocodone-Acetaminophen) .... One Tablet 4 Times Daily As Needed For Back Pain.  Not To Exceed 4 in 24 Hrs. 6)  Oyster Shell Calcium/d 500-400 Mg-Unit Tabs (Calcium-Vitamin D) .... Take 1  Tablet By Mouth Three Times A Day 7)  Vitamin D (Ergocalciferol) 50000 Unit Caps (Ergocalciferol) .... One Tab By Mouth Every Week 8)  Trilipix 135 Mg Cpdr (Choline Fenofibrate) .... Take 1 Tab By Mouth At Bedtime 9)  Maxzide-25 37.5-25 Mg Tabs (Triamterene-Hctz) .... Take 1 Tablet By Mouth Once A Day 10)  Carafate 1 Gm Tabs (Sucralfate) .... Take 1 Tablet By Mouth Four Times A Day  Allergies (verified): 1)  ! Asa 2)  ! Lipitor (Atorvastatin)  Review of Systems General:  Complains of fatigue, malaise, sleep disorder, and weight loss; denies chills and fever. Eyes:  Complains of vision loss-both eyes; denies discharge, eye pain, and red eye. ENT:  Denies earache, hoarseness, nasal congestion, and sinus pressure. CV:  Denies chest pain or discomfort, difficulty breathing while lying down, palpitations, shortness of breath with exertion, and swelling of feet. Resp:  Complains of shortness of breath; denies cough and sputum productive. GU:  Denies dysuria, incontinence, urinary frequency, and urinary hesitancy. MS:  Complains of low back pain, muscle aches, and muscle weakness; denies joint pain and stiffness. Derm:  Denies itching, lesion(s), and rash. Neuro:  Complains of headaches; denies seizures and sensation of room spinning. Psych:  Complains of anxiety, depression, mental problems, and sense of great  danger; denies suicidal thoughts/plans, thoughts of violence, and unusual visions or sounds. Endo:  Complains of weight change; denies cold intolerance, excessive hunger, excessive thirst, and heat intolerance. Heme:  Denies abnormal bruising and bleeding. Allergy:  Complains of seasonal allergies.  Physical Exam  General:  Well-developed,UNDERWEIGHT APPEARING,in no acute distress; alert,appropriate and cooperative throughout examination. pT HAS BOTH ANXIETY AND DEPRESSION Head:  Normocephalic and atraumatic without obvious abnormalities. No apparent alopecia or balding. Eyes:  No  corneal or conjunctival inflammation noted. EOMI. Perrla. Funduscopic exam benign, without hemorrhages, exudates or papilledema. Vision grossly normal. Ears:  External ear exam shows no significant lesions or deformities.  Otoscopic examination reveals clear canals, tympanic membranes are intact bilaterally without bulging, retraction, inflammation or discharge. Hearing is grossly normal bilaterally. Nose:  External nasal examination shows no deformity or inflammation. Nasal mucosa are pink and moist without lesions or exudates. Mouth:  pharynx pink and moist, poor dentition, and teeth missing.   Neck:  No deformities, masses, or tenderness noted. Chest Wall:  No deformities, masses, or tenderness noted. Breasts:  No mass, nodules, thickening, tenderness, bulging, retraction, inflamation, nipple discharge or skin changes noted.   Lungs:  Normal respiratory effort, chest expands symmetrically. Lungs are clear to auscultation, no crackles or wheezes.DECREASERD air entry bilaterally Heart:  Normal rate and regular rhythm. S1 and S2 normal without gallop, murmur, click, rub or other extra sounds. Abdomen:  Bowel sounds positive,abdomen soft and non-tender without masses, organomegaly or hernias noted. Rectal:  No external abnormalities noted. Normal sphincter tone. No rectal masses or tenderness.Guaic negative stool Genitalia:  Normal introitus for age, no external lesions, no vaginal discharge, mucosa pink and moist, no vaginal or cervical lesions, no vaginal atrophy, no friaility or hemorrhage, normal uterus size and position, no adnexal masses or tenderness Msk:   scoliosis noted of thoracic or lumbar spine.   Pulses:  R and L carotid,radial,femoral,dorsalis pedis and posterior tibial pulses are full and equal bilaterally Extremities:  adequate ROM hips, and knees reduced in spine Neurologic:  No cranial nerve deficits noted. Station and gait are normal. Plantar reflexes are down-going bilaterally.  DTRs are symmetrical throughout. Sensory, motor and coordinative functions appear intact. Skin:  Intact without suspicious lesions or rashes Cervical Nodes:  No lymphadenopathy noted Axillary Nodes:  No palpable lymphadenopathy Inguinal Nodes:  No significant adenopathy Psych:  Oriented X3, good eye contact, tearful, and moderately anxious.     Impression & Recommendations:  Problem # 1:  NECK PAIN, CHRONIC (ICD-723.1) Assessment Deteriorated  Her updated medication list for this problem includes:    Hydrocodone-acetaminophen 10-325 Mg Tabs (Hydrocodone-acetaminophen) ..... One tablet 4 times daily as needed for back pain.  not to exceed 4 in 24 hrs.  Problem # 2:  DEPRESSION, CHRONIC (ICD-311) Assessment: Deteriorated  Her updated medication list for this problem includes:    Alprazolam 0.25 Mg Tabs (Alprazolam) .Marland Kitchen... Take 1 tab by mouth at bedtime    Sertraline Hcl 25 Mg Tabs (Sertraline hcl) .Marland Kitchen... Take 1 tablet by mouth once a day  Problem # 3:  NICOTINE ADDICTION (ICD-305.1) Assessment: Deteriorated  Encouraged smoking cessation and discussed different methods for smoking cessation.   Problem # 4:  HYPERLIPIDEMIA (ICD-272.4) Assessment: Comment Only  The following medications were removed from the medication list:    Trilipix 135 Mg Cpdr (Choline fenofibrate) .Marland Kitchen... Take 1 tab by mouth at bedtime Her updated medication list for this problem includes:    Simvastatin 10 Mg Tabs (Simvastatin) .Marland Kitchen... Take 1 tab by mouth  at bedtime  Orders: T-Hepatic Function [id number]) T-Lipid Profile [id number])  Labs Reviewed: SGOT: 17 (12/09/2009)   SGPT: 13 (12/09/2009)   HDL:55 (12/08/2009), 55 (02/17/2009)  LDL:200 (12/08/2009), 175 (02/17/2009)  Chol:280 (12/08/2009), 249 (02/17/2009)  Trig:127 (12/08/2009), 95 (02/17/2009) reports intolerANCE TO LIPITOE AND TRILIPIX  Problem # 5:  ESSENTIAL HYPERTENSION, BENIGN (ICD-401.1) Assessment: Improved  The following medications were  removed from the medication list:    Maxzide-25 37.5-25 Mg Tabs (Triamterene-hctz) .Marland Kitchen... Take 1 tablet by mouth once a day Her updated medication list for this problem includes:    Clonidine Hcl 0.3 Mg Tabs (Clonidine hcl) ..... One and one half tab by mouth once daily    Maxzide 75-50 Mg Tabs (Triamterene-hctz) .Marland Kitchen... Take 1 tablet by mouth once a day  Orders: T-Basic Metabolic Panel ([id number])  BP today: 164/76 Prior BP: 180/70 (02/15/2010)  Labs Reviewed: K+: 3.8 (12/14/2009) Creat: : 0.79 (12/09/2009)   Chol: 280 (12/08/2009)   HDL: 55 (12/08/2009)   LDL: 200 (12/08/2009)   TG: 127 (12/08/2009)  Complete Medication List: 1)  Clonidine Hcl 0.3 Mg Tabs (Clonidine hcl) .... One and one half tab by mouth once daily 2)  Nitroquick 0.4 Mg Subl (Nitroglycerin) .... One tab under tounge as needed (uad) 3)  Prilosec 20 Mg Cpdr (Omeprazole) .Marland Kitchen.. 1 by mouth first thing in the morning and 30 minutes before supper 4)  Alprazolam 0.25 Mg Tabs (Alprazolam) .... Take 1 tab by mouth at bedtime 5)  Hydrocodone-acetaminophen 10-325 Mg Tabs (Hydrocodone-acetaminophen) .... One tablet 4 times daily as needed for back pain.  not to exceed 4 in 24 hrs. 6)  Oyster Shell Calcium/d 500-400 Mg-unit Tabs (Calcium-vitamin d) .... Take 1 tablet by mouth three times a day 7)  Vitamin D (ergocalciferol) 50000 Unit Caps (Ergocalciferol) .... One tab by mouth every week 8)  Carafate 1 Gm Tabs (Sucralfate) .... Take 1 tablet by mouth four times a day 9)  Maxzide 75-50 Mg Tabs (Triamterene-hctz) .... Take 1 tablet by mouth once a day 10)  Sertraline Hcl 25 Mg Tabs (Sertraline hcl) .... Take 1 tablet by mouth once a day 11)  Simvastatin 10 Mg Tabs (Simvastatin) .... Take 1 tab by mouth at bedtime  Other Orders: T-CBC w/Diff [id number]) T-TSH [id number]) T-Vitamin D (25-Hydroxy) [phone]) Pelvic & Breast Exam ( Medicare)  (G0101) Hemoccult Guaiac-1 spec.(in office) (23762)  Patient Instructions: 1)   Please schedule a follow-up appointment in 1 month. 2)  Tobacco is very bad for your health and your loved ones! You Should stop smoking!. 3)  Stop Smoking Tips: Choose a Quit date. Cut down before the Quit date. decide what you will do as a substitute when you feel the urge to smoke(gum,toothpick,exercise). 4)  Bp is better but still highdose increase in maxzide, tak TWO tabs daily of your current pills till done, the new pill will be One daily 5)  BMP prior to visit, ICD-9: 6)  Hepatic Panel prior to visit, ICD-9: 7)  Lipid Panel prior to visit, ICD-9: 8)  TSH prior to visit, ICD-9:   fasting asap 9)  CBC w/ Diff prior to visit, ICD-9: 10)  Vit D 11)  You will start a new med for anxiety and depression Prescriptions: SIMVASTATIN 10 MG TABS (SIMVASTATIN) Take 1 tab by mouth at bedtime  #30 x 3   Entered and Authorized by:   Syliva Overman MD   Signed by:   Syliva Overman MD on 03/01/2010   Method used:   Electronically to  [facility] (retail)       [address]       [place], [place]  [address]       Ph: [phone]       Fax: [phone]   RxID:   [id number] SERTRALINE HCL 25 MG TABS (SERTRALINE HCL) Take 1 tablet by mouth once a day  #30 x 3   Entered and Authorized by:   Syliva Overman MD   Signed by:   Syliva Overman MD on 03/01/2010   Method used:   Electronically to        [facility] (retail)       [address]       [place], [place]  [address]       Ph: [phone]       Fax: [phone]   RxID:   [id number] MAXZIDE 75-50 MG TABS (TRIAMTERENE-HCTZ) Take 1 tablet by mouth once a day  #30 x 1   Entered and Authorized by:   Syliva Overman MD   Signed by:   Syliva Overman MD on 03/01/2010   Method used:   Printed then faxed to ...       [facility] (retail)       [address]       [place], [place]  [address]       Ph: [phone]        Fax: [phone]   RxID:   [id number]   Laboratory Results    Stool - Occult Blood Hemmoccult #1: negative Date: 03/01/2010 Comments: [id number] (r 8/11 [phone]

## 2010-12-21 NOTE — Assessment & Plan Note (Signed)
Summary: office visit   Allergies: 1)  ! Asa 2)  ! Lipitor (Atorvastatin)   Complete Medication List: 1)  Clonidine Hcl 0.3 Mg Tabs (Clonidine hcl) .... One and one half tab by mouth once daily 2)  Nitroquick 0.4 Mg Subl (Nitroglycerin) .... One tab under tounge as needed (uad) 3)  Prilosec 20 Mg Cpdr (Omeprazole) .Marland Kitchen.. 1 by mouth first thing in the morning and 30 minutes before supper 4)  Alprazolam 0.25 Mg Tabs (Alprazolam) .... Take 1 tab by mouth at bedtime 5)  Hydrocodone-acetaminophen 10-325 Mg Tabs (Hydrocodone-acetaminophen) .... One tablet 4 times daily as needed for back pain.  not to exceed 4 in 24 hrs. 6)  Oyster Shell Calcium/d 500-400 Mg-unit Tabs (Calcium-vitamin d) .... Take 1 tablet by mouth three times a day 7)  Vitamin D (ergocalciferol) 50000 Unit Caps (Ergocalciferol) .... One tab by mouth every week 8)  Carafate 1 Gm Tabs (Sucralfate) .... Take 1 tablet by mouth four times a day 9)  Maxzide 75-50 Mg Tabs (Triamterene-hctz) .... Take 1 tablet by mouth once a day 10)  Sertraline Hcl 25 Mg Tabs (Sertraline hcl) .... Take 1 tablet by mouth once a day 11)  Simvastatin 10 Mg Tabs (Simvastatin) .... Take 1 tab by mouth at bedtime pt left without being seen, she rescheduled the appt

## 2010-12-21 NOTE — Assessment & Plan Note (Signed)
Summary: office visit   Vital Signs:  Patient profile:   55 year old female Menstrual status:  postmenopausal Height:      62 inches Weight:      116.13 pounds BMI:     21.32 O2 Sat:      98 % Pulse rate:   64 / minute Pulse rhythm:   regular Resp:     16 per minute BP sitting:   140 / 70  (left arm) Cuff size:   regular  Vitals Entered By: Everitt Amber LPN (Apr 08, 2010 11:31 AM) CC: Still having the back pain  Pain Assessment Patient in pain? yes     Location: back  Intensity: 10 Type: aching Onset of pain  last 2 days   Primary Care Provider:  Dr. Lodema Hong  CC:  Still having the back pain .  History of Present Illness: Pt reports uncontrolled severe back pain, and has an appt at [facility] tomorrow. Her nicotine use is unchanged, and no quit date is set. She reports less anxiety and depression, states she gets on with none of her family and has left them to be responsible for her mother's care. he is tolerating her antihypertensive med and denies adverse s/e , she reports compliance.  Preventive Screening-Counseling & Management  Alcohol-Tobacco     Smoking Cessation Counseling: yes  Allergies: 1)  ! Asa 2)  ! Lipitor (Atorvastatin)  Review of Systems      See HPI General:  Complains of loss of appetite. Eyes:  Complains of vision loss-both eyes; sister recently dx with macular degeneration. ENT:  Denies hoarseness, nasal congestion, sinus pressure, and sore throat. CV:  Denies difficulty breathing at night, difficulty breathing while lying down, lightheadness, palpitations, shortness of breath with exertion, and swelling of feet. Resp:  Denies cough and sputum productive. GI:  Denies abdominal pain, constipation, diarrhea, nausea, and vomiting. GU:  Denies dysuria and urinary frequency. MS:  Complains of low back pain and mid back pain. Derm:  Denies lesion(s) and rash. Neuro:  Complains of numbness and weakness; lower extremity. Psych:  Complains  of anxiety and depression; denies mental problems, suicidal thoughts/plans, thoughts of violence, and unusual visions or sounds. Endo:  Denies excessive hunger, excessive thirst, and excessive urination. Heme:  Denies abnormal bruising and bleeding. Allergy:  Denies hives or rash and itching eyes.  Physical Exam  General:  Well-developed,UNDERWEIGHT APPEARING,in no acute distress; alert,appropriate and cooperative throughout examination. pT HAS BOTH ANXIETY AND DEPRESSION HEENT: No facial asymmetry,  EOMI, No sinus tenderness, TM's Clear, oropharynx  pink and moist.   Chest: Clear to auscultation bilaterally.  CVS: S1, S2, No murmurs, No S3.   Abd: Soft, Nontender.  MS: decreased  ROM spine,aDEQUATE IN  hips, shoulders and knees.  Ext: No edema.   CNS: CN 2-12 intact, power tone and sensation normal throughout.   Skin: Intact, no visible lesions or rashes.  Psych: Good eye contact, normal affect.  Memory intact,   Impression & Recommendations:  Problem # 1:  DEPRESSION, CHRONIC (ICD-311) Assessment Unchanged  Her updated medication list for this problem includes:    Alprazolam 0.25 Mg Tabs (Alprazolam) .Marland Kitchen... Take 1 tab by mouth at bedtime    Sertraline Hcl 25 Mg Tabs (Sertraline hcl) .Marland Kitchen... Take 1 tablet by mouth once a day  Problem # 2:  BACK PAIN, CHRONIC (ICD-724.5) Assessment: Deteriorated  Her updated medication list for this problem includes:    Hydrocodone-acetaminophen 10-325 Mg Tabs (Hydrocodone-acetaminophen) ..... One tablet 4  times daily as needed for back pain.  not to exceed 4 in 24 hrs.  Orders: Ketorolac-Toradol 15mg  (W1191) Admin of Therapeutic Inj  intramuscular or subcutaneous (47829)  Problem # 3:  NICOTINE ADDICTION (ICD-305.1) Assessment: Unchanged  Encouraged smoking cessation and discussed different methods for smoking cessation.   Problem # 4:  ESSENTIAL HYPERTENSION, BENIGN (ICD-401.1) Assessment: Improved  Her updated medication list for this  problem includes:    Clonidine Hcl 0.3 Mg Tabs (Clonidine hcl) ..... One and one half tab by mouth once daily    Maxzide 75-50 Mg Tabs (Triamterene-hctz) .Marland Kitchen... Take 1 tablet by mouth once a day  BP today: 140/70 Prior BP: 164/76 (03/01/2010)  Labs Reviewed: K+: 3.8 (12/14/2009) Creat: : 0.79 (12/09/2009)   Chol: 280 (12/08/2009)   HDL: 55 (12/08/2009)   LDL: 200 (12/08/2009)   TG: 127 (12/08/2009)  Complete Medication List: 1)  Clonidine Hcl 0.3 Mg Tabs (Clonidine hcl) .... One and one half tab by mouth once daily 2)  Nitroquick 0.4 Mg Subl (Nitroglycerin) .... One tab under tounge as needed (uad) 3)  Prilosec 20 Mg Cpdr (Omeprazole) .Marland Kitchen.. 1 by mouth first thing in the morning and 30 minutes before supper 4)  Alprazolam 0.25 Mg Tabs (Alprazolam) .... Take 1 tab by mouth at bedtime 5)  Hydrocodone-acetaminophen 10-325 Mg Tabs (Hydrocodone-acetaminophen) .... One tablet 4 times daily as needed for back pain.  not to exceed 4 in 24 hrs. 6)  Oyster Shell Calcium/d 500-400 Mg-unit Tabs (Calcium-vitamin d) .... Take 1 tablet by mouth three times a day 7)  Vitamin D (ergocalciferol) 50000 Unit Caps (Ergocalciferol) .... One tab by mouth every week 8)  Carafate 1 Gm Tabs (Sucralfate) .... Take 1 tablet by mouth four times a day 9)  Maxzide 75-50 Mg Tabs (Triamterene-hctz) .... Take 1 tablet by mouth once a day 10)  Sertraline Hcl 25 Mg Tabs (Sertraline hcl) .... Take 1 tablet by mouth once a day 11)  Simvastatin 10 Mg Tabs (Simvastatin) .... Take 1 tab by mouth at bedtime  Other Orders: T-Basic Metabolic Panel [id number]) T-Hepatic Function [id number]) T-Lipid Profile [phone]) T-CBC w/Diff [id number]) T-TSH [id number]) T-Vitamin D (25-Hydroxy) [id number])  Patient Instructions: 1)  Please schedule a follow-up appointment in 3 months. 2)  Tobacco is very bad for your health and your loved ones! You Should stop smoking!. 3)  Stop Smoking Tips: Choose a Quit date. Cut down  before the Quit date. decide what you will do as a substitute when you feel the urge to smoke(gum,toothpick,exercise). 4)  BMP prior to visit, ICD-9: 5)  Hepatic Panel prior to visit, ICD-9: 6)  Lipid Panel prior to visit, ICD-9: 7)  CBC w/ Diff prior to visit, ICD-9: 8)  Vit D and TSh    Medication Administration  Injection # 1:    Medication: Ketorolac-Toradol 15mg     Diagnosis: BACK PAIN, CHRONIC (ICD-724.5)    Route: IM    Site: RUOQ gluteus    Exp Date: 10/2011    Lot #: [id number]     Mfr: novaplus    Comments: 60mg  given     Patient tolerated injection without complications    Given by: Everitt Amber LPN (Apr 08, 2010 12:03 PM)  Orders Added: 1)  Est. Patient Level IV [88416] 2)  T-Basic Metabolic Panel [[id number]] 3)  T-Hepatic Function [[id number]] 4)  T-Lipid Profile [[id number]] 5)  T-CBC w/Diff [[id number]] 6)  T-TSH [[id number]] 7)  T-Vitamin D (25-Hydroxy) [76283-15176] 8)  Ketorolac-Toradol 15mg  [J1885] 9)  Admin of  Therapeutic Inj  intramuscular or subcutaneous [11914]

## 2010-12-21 NOTE — Progress Notes (Signed)
Summary: ear infection  Phone Note Call from Patient   Summary of Call: wants a ant.  she has a ear infection please call in at [facility]  [phone] call when done Initial call taken by: Lind Guest,  October 05, 2010 11:47 AM  Follow-up for Phone Call        if patient calls back  patient cannot be given antibotic without being seen, since no opening avaiable will need to go to urgent care  both #'s disconnected Follow-up by: Adella Hare LPN,  October 05, 2010 12:17 PM  Additional Follow-up for Phone Call Additional follow up Details #1::        DID YOU CALL [phone] THAT WAS ON THE NOTE Additional Follow-up by: Lind Guest,  October 05, 2010 12:52 PM    Additional Follow-up for Phone Call Additional follow up Details #2::    i called number left in phone note, unless i misdialed, it said disconnected Follow-up by: Adella Hare LPN,  October 05, 2010 4:08 PM  Additional Follow-up for Phone Call Additional follow up Details #3:: Details for Additional Follow-up Action Taken: patient called back at 4pm this evening, very upset, advised patient i attempted to return her call, i apologized, proceeded to let patient know we have no openings and if she feels as though she needs to be seen for this she needs to go to urgent care. patient proceeded to curse at me and I hung the phone up. advised dr Lodema Hong of the call Additional Follow-up by: Adella Hare LPN,  October 05, 2010 4:12 PM  cursing at staff is unacceptable and will discuss procedure with management , this is a reason for termination of physician patient relationship and i believe this is the right thing to do.

## 2010-12-21 NOTE — Progress Notes (Signed)
Summary: PERSONAL CARE  Phone Note Call from Patient   Summary of Call: NEEDS PERSONAL CARE ASSISTANCE  THROUGH [facility] THEY TOLD HER TO CALL HERE AND GET IT STARTED  CALL BACK AT [phone] SHE NOW HAS MEDICAID ALSO Initial call taken by: Lind Guest,  February 01, 2010 11:39 AM  Follow-up for Phone Call        patient needs appt to be evaluated for pcs services  called patient, left message Follow-up by: Adella Hare LPN,  February 01, 2010 2:27 PM  Additional Follow-up for Phone Call Additional follow up Details #1::        patient aware Additional Follow-up by: Adella Hare LPN,  February 03, 2010 8:51 AM

## 2010-12-21 NOTE — Progress Notes (Signed)
  Phone Note Call from Patient   Caller: Patient Summary of Call: cough and chest congestion, yellow sputum, chills and fever advised urgent care for eval Initial call taken by: Adella Hare LPN,  September 28, 2010 11:23 AM

## 2010-12-21 NOTE — Assessment & Plan Note (Signed)
Summary: office visit   Vital Signs:  Patient profile:   55 year old female Menstrual status:  postmenopausal Height:      62 inches Weight:      123.75 pounds BMI:     22.72 O2 Sat:      98 % Pulse rate:   83 / minute Pulse rhythm:   regular Resp:     16 per minute BP sitting:   180 / 70  (left arm) Cuff size:   regular  Vitals Entered By: Everitt Amber LPN (February 15, 2010 1:46 PM) CC: has had a headache in the back of head for 3 days, doesn't normally have headache Pain Assessment Patient in pain? yes     Location: headache Intensity: 9 Type: aching Onset of pain  3 days ago   Primary Care Provider:  Dr. Lodema Hong  CC:  has had a headache in the back of head for 3 days and doesn't normally have headache.  History of Present Illness: Pt c/o post headache ad neck pain, says this is interfering wit her ability to do housework and requests aid for laundry and cleaning her house, she states she has difficulty cooking also combing  her hair.She requests daily pCa during the week. Feels unsteady in the bath requstys shower chair, also asked abbout pillow, for neck pain, however I advised her to talk to ortho. Smoking cigarettes half PPd inc stress since her Mom is in a nursing home. C/O therush on the tongue x 3 weeks, had 2 epidural injections in the past 4 weeks. She denies fever or chills. She denies head or chest congestion. She has a chrnic smoker's cough. She reports increased anxiety and depression, esp since her mother's health has deteriorated , wants to see psych, she denies suicidal or homicidal ideation.   Preventive Screening-Counseling & Management  Alcohol-Tobacco     Smoking Cessation Counseling: yes  Current Medications (verified): 1)  Clonidine Hcl 0.3 Mg  Tabs (Clonidine Hcl) .... One and One Half Tab By Mouth Once Daily 2)  Nitroquick 0.4 Mg  Subl (Nitroglycerin) .... One Tab Under Tounge As Needed (Uad) 3)  Prilosec 20 Mg Cpdr (Omeprazole) .Marland Kitchen.. 1 By Mouth  First Thing in The Morning and 30 Minutes Before Supper 4)  Alprazolam 0.25 Mg Tabs (Alprazolam) .... Take 1 Tab By Mouth At Bedtime 5)  Hydrocodone-Acetaminophen 10-325 Mg Tabs (Hydrocodone-Acetaminophen) .... One Tablet 4 Times Daily As Needed For Back Pain.  Not To Exceed 4 in 24 Hrs. 6)  Oyster Shell Calcium/d 500-400 Mg-Unit Tabs (Calcium-Vitamin D) .... Take 1 Tablet By Mouth Three Times A Day 7)  Vitamin D (Ergocalciferol) 50000 Unit Caps (Ergocalciferol) .... One Tab By Mouth Every Week  Allergies (verified): 1)  ! Asa 2)  ! Lipitor (Atorvastatin)  Past History:  Past Medical History: H. pylori serology pos-took it all, Sx got better. 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 HTN NICOTINE DEPENDENCE DEPRESSION ACUTE AND CHRONIC BACK PAIN hyperlipidemia Macular degeneration dx in 2010  Past Surgical History: [place] ROD 1978 TOOK OUT 1982 Rotator cuff  right  2004 Tubal Ligation Exploratory laparoscopy anterior cervical fusion, 2 level 07/2009  Family History: Mother- Hypertension, arthritis, esrd Father- CVA, hypertension  deceased at age 62 stroke 4 sisters- hypertension, one with macular degeneration 5 brothers- 2 living healthy, i brother deceased  in his 35's AIDS, one brother shot , the other was alcoholic No FH of Colon Cancer or polyps Brother has kidney stones.  Social History: Single Current  Smoker: 1/2 UE/AV409 yrs Alcohol use-no Drug use-no  Disabled: scoliosis and DDD  Review of Systems      See HPI General:  Complains of fatigue, weakness, and weight loss; denies chills and fever. Eyes:  Denies blurring and discharge. ENT:  Denies hoarseness, nasal congestion, sinus pressure, and sore throat. CV:  Denies chest pain or discomfort, palpitations, and swelling of feet. Resp:  Complains of cough, shortness of breath, and sputum productive. GI:  Denies abdominal pain, constipation, diarrhea, nausea, and vomiting. GU:  Denies dysuria and  urinary frequency. MS:  Complains of joint pain, joint swelling, loss of strength, low back pain, mid back pain, muscle weakness, and stiffness. Derm:  Denies itching and rash. Neuro:  Complains of headaches; denies seizures and sensation of room spinning. Psych:  Complains of anxiety, depression, easily tearful, irritability, mental problems, and sense of great danger; denies suicidal thoughts/plans, thoughts of violence, and unusual visions or sounds. Endo:  Denies cold intolerance, excessive hunger, excessive thirst, and excessive urination. Heme:  Denies abnormal bruising and bleeding. Allergy:  Complains of seasonal allergies.  Physical Exam  General:  Well-developed,well-nourished,in no acute distress; alert,appropriate and cooperative throughout examinationP anxious, depressed and in pain. HEENT: No facial asymmetry,  EOMI, No sinus tenderness, TM's Clear, oropharynx  pink and moist. Oral thrush.  Chest: Clear to auscultation bilaterally. decreased air entry throughout. CVS: S1, S2, No murmurs, No S3.   Abd: Soft, Nontender.  MS: decreased ROM spine,adequate in  hips, shoulders and knees.  Ext: No edema.   CNS: CN 2-12 intact, power tone and sensation normal throughout.   Skin: Intact, no visible lesions or rashes.  Psych: Good eye contact, normal affect.  Memory intact,.    Impression & Recommendations:  Problem # 1:  NECK PAIN, CHRONIC (ICD-723.1) Assessment Deteriorated  Her updated medication list for this problem includes:    Hydrocodone-acetaminophen 10-325 Mg Tabs (Hydrocodone-acetaminophen) ..... One tablet 4 times daily as needed for back pain.  not to exceed 4 in 24 hrs.  Orders: Physical Therapy Referral (PT)  Problem # 2:  DEPRESSION, CHRONIC (ICD-311) Assessment: Deteriorated  Her updated medication list for this problem includes:    Alprazolam 0.25 Mg Tabs (Alprazolam) .Marland Kitchen... Take 1 tab by mouth at bedtime  Orders: Psychology Referral  (Psychology)  Problem # 3:  ESSENTIAL HYPERTENSION, BENIGN (ICD-401.1) Assessment: Deteriorated  The following medications were removed from the medication list:    Hydrochlorothiazide 12.5 Mg Caps (Hydrochlorothiazide) .Marland Kitchen... Take 1 tablet by mouth once a day Her updated medication list for this problem includes:    Clonidine Hcl 0.3 Mg Tabs (Clonidine hcl) ..... One and one half tab by mouth once daily    Maxzide-25 37.5-25 Mg Tabs (Triamterene-hctz) .Marland Kitchen... Take 1 tablet by mouth once a day  BP today: 180/70 Prior BP: 122/58 (01/14/2010)  Labs Reviewed: K+: 3.8 (12/14/2009) Creat: : 0.79 (12/09/2009)   Chol: 280 (12/08/2009)   HDL: 55 (12/08/2009)   LDL: 200 (12/08/2009)   TG: 127 (12/08/2009)  Problem # 4:  HYPERLIPIDEMIA (ICD-272.4) Assessment: Comment Only  The following medications were removed from the medication list:    Lipitor 40 Mg Tabs (Atorvastatin calcium) ..... One tab by mouth qhs Her updated medication list for this problem includes:    Trilipix 135 Mg Cpdr (Choline fenofibrate) .Marland Kitchen... Take 1 tab by mouth at bedtime  Labs Reviewed: SGOT: 17 (12/09/2009)   SGPT: 13 (12/09/2009)   HDL:55 (12/08/2009), 55 (02/17/2009)  LDL:200 (12/08/2009), 175 (02/17/2009)  Chol:280 (12/08/2009), 249 (02/17/2009)  Trig:127 (12/08/2009), 95 (02/17/2009)  Problem # 5:  CANDIDIASIS, ORAL (ICD-112.0) Assessment: Comment Only fluconazole prescribed  Complete Medication List: 1)  Clonidine Hcl 0.3 Mg Tabs (Clonidine hcl) .... One and one half tab by mouth once daily 2)  Nitroquick 0.4 Mg Subl (Nitroglycerin) .... One tab under tounge as needed (uad) 3)  Prilosec 20 Mg Cpdr (Omeprazole) .Marland Kitchen.. 1 by mouth first thing in the morning and 30 minutes before supper 4)  Alprazolam 0.25 Mg Tabs (Alprazolam) .... Take 1 tab by mouth at bedtime 5)  Hydrocodone-acetaminophen 10-325 Mg Tabs (Hydrocodone-acetaminophen) .... One tablet 4 times daily as needed for back pain.  not to exceed 4 in 24 hrs. 6)   Oyster Shell Calcium/d 500-400 Mg-unit Tabs (Calcium-vitamin d) .... Take 1 tablet by mouth three times a day 7)  Vitamin D (ergocalciferol) 50000 Unit Caps (Ergocalciferol) .... One tab by mouth every week 8)  Trilipix 135 Mg Cpdr (Choline fenofibrate) .... Take 1 tab by mouth at bedtime 9)  Fluconazole 150 Mg Tabs (Fluconazole) .... Take 1 tablet by mouth once a day 10)  Maxzide-25 37.5-25 Mg Tabs (Triamterene-hctz) .... Take 1 tablet by mouth once a day 11)  Carafate 1 Gm Tabs (Sucralfate) .... Take 1 tablet by mouth four times a day  Patient Instructions: 1)  CPE as before. 2)  Pls start med for high cholesterol , also med id sent in for thrush. 3)  Yopu will be referred to pT and also for PCA services, and a script will be given for a shower chair 4)  Tobacco is very bad for your health and your loved ones! You Should stop smoking!. 5)  Stop Smoking Tips: Choose a Quit date. Cut down before the Quit date. decide what you will do as a substitute when you feel the urge to smoke(gum,toothpick,exercise). 6)  Your bp is too high , you need to start another Bp med, continue the clonidine Prescriptions: CARAFATE 1 GM TABS (SUCRALFATE) Take 1 tablet by mouth four times a day  #120 x 2   Entered and Authorized by:   Syliva Overman MD   Signed by:   Syliva Overman MD on 02/18/2010   Method used:   Printed then faxed to ...       [facility]* (retail)       [address]       [place], [place]  [address]       Ph: [phone]       Fax: [phone]   RxID:   [id number] ALPRAZOLAM 0.25 MG TABS (ALPRAZOLAM) Take 1 tab by mouth at bedtime  #30 x 2   Entered by:   Everitt Amber LPN   Authorized by:   Syliva Overman MD   Signed by:   Everitt Amber LPN on 40/34/7425   Method used:   Printed then faxed to ...       [facility]* (retail)       [address]       [place], [place]  [address]       Ph: [phone]        Fax: [phone]   RxID:   [id number] CLONIDINE HCL 0.3 MG  TABS (CLONIDINE HCL) one and one half tab by mouth once daily  #45 Each x 3   Entered by:   Everitt Amber LPN   Authorized by:   Syliva Overman MD  Signed by:   Everitt Amber LPN on 04/54/0981   Method used:   Printed then faxed to ...       [facility]* (retail)       [address]       [place], [place]  [address]       Ph: [phone]       Fax: [phone]   RxID:   [id number] MAXZIDE-25 37.5-25 MG TABS (TRIAMTERENE-HCTZ) Take 1 tablet by mouth once a day  #30 x 3   Entered and Authorized by:   Syliva Overman MD   Signed by:   Syliva Overman MD on 02/15/2010   Method used:   Electronically to        [facility]* (retail)       [address] [place], [place]  [address]       Ph: [phone]       Fax: [phone]   RxID:   [id number] FLUCONAZOLE 150 MG TABS (FLUCONAZOLE) Take 1 tablet by mouth once a day  #3 x 0   Entered and Authorized by:   Syliva Overman MD   Signed by:   Syliva Overman MD on 02/15/2010   Method used:   Electronically to        [facility]* (retail)       [address] [place], [place]  [address]       Ph: [phone]       Fax: [phone]   RxID:   [id number] TRILIPIX 135 MG CPDR (CHOLINE FENOFIBRATE) Take 1 tab by mouth at bedtime  #30 x 3   Entered and Authorized by:   Syliva Overman MD   Signed by:   Syliva Overman MD on 02/15/2010   Method used:   Electronically to        [facility]* (retail)       [address]       [place], [place]  [address]       Ph: [phone]       Fax: [phone]   RxID:   [id number]

## 2010-12-21 NOTE — Miscellaneous (Signed)
Summary: refill  Clinical Lists Changes  Medications: Rx of HYDROCODONE-ACETAMINOPHEN 10-325 MG TABS (HYDROCODONE-ACETAMINOPHEN) one tablet three times daily for 3 days, then one twice daily;  #60 x 0;  Signed;  Entered by: Worthy Keeler LPN;  Authorized by: Syliva Overman MD;  Method used: Telephoned to [facility] [address] [place], [place], [place]  [address], Ph: [phone], Fax: [phone]    Prescriptions: HYDROCODONE-ACETAMINOPHEN 10-325 MG TABS (HYDROCODONE-ACETAMINOPHEN) one tablet three times daily for 3 days, then one twice daily  #60 x 0   Entered by:   Worthy Keeler LPN   Authorized by:   Syliva Overman MD   Signed by:   Worthy Keeler LPN on 35/57/3220   Method used:   Telephoned to ...       [facility]* (retail)       [address]       [place], [place]  [address]       Ph: [phone]       Fax: [phone]   RxID:   [id number]

## 2010-12-21 NOTE — Assessment & Plan Note (Signed)
Summary: office visit   Vital Signs:  Patient profile:   55 year old female Menstrual status:  postmenopausal Height:      62 inches Weight:      113.25 pounds BMI:     20.79 O2 Sat:      98 % Pulse rate:   61 / minute Pulse rhythm:   regular Resp:     16 per minute BP sitting:   160 / 70  (left arm) Cuff size:   regular  Vitals Entered By: Everitt Amber LPN (July 13, 2010 10:58 AM) CC: neck has been hurting on left side down her left arm to elbow for a week or so and also she has had a headache for 7 days and she doesn't usually get them Comments didn't bring meds   Primary Care Provider:  Dr. Lodema Hong  CC:  neck has been hurting on left side down her left arm to elbow for a week or so and also she has had a headache for 7 days and she doesn't usually get them.  History of Present Illness: pt reports that she has continued to deteriorate since the passing of her mother. She is depressed, has no relationship with her siblings, poor sleep and headache. She has quit smoking reportedly. She c/o inc neck and upper ext pain and would like to receive an epidural injection once more, she has attempted to cotact the proveiders woith no success. She denies any recent fever or chills, head or chest congestion. She denies dysuria , frequency or incontinence  Preventive Screening-Counseling & Management  Alcohol-Tobacco     Smoking Cessation Counseling: yes  Allergies (verified): 1)  ! Asa 2)  ! Lipitor (Atorvastatin)  Past History:  Past medical, surgical, family and social histories (including risk factors) reviewed, and no changes noted (except as noted below).  Past Medical History: Reviewed history from 02/15/2010 and no changes required. H. pylori serology pos-took it all, Sx got better. 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 HARR[place] ROD 1978 TOOK OUT 1982 Rotator cuff  right  04-09-03 Tubal Ligation Exploratory laparoscopy anterior cervical fusion, 2 level 07/2009  Family History: Reviewed history from 02/15/2010 and no changes required. Mother- Hypertension, arthritis, esrd, mom died at age 80  in 04-08-2010 Father- CVA, hypertension  deceased at age 48 stroke 4 sisters- hypertension, one with macular degeneration 5 brothers- 2 living healthy, i brother deceased  in his 58's AIDS, one brother shot , the other was alcoholic No FH of Colon Cancer or polyps Brother has kidney stones.  Social History: Reviewed history from 02/15/2010 and no changes required. Single Current Smoker: 1/2 ZO/XW96 yrs, stopped in August 2011 Alcohol use-no Drug use-no  Disabled: scoliosis and DDD  Review of Systems      See HPI General:  Complains of fatigue, malaise, and sleep disorder. Eyes:  Denies blurring, discharge, and eye pain; pos fam h/o glaucoma with significant loss of vision. GI:  Denies abdominal pain, constipation, diarrhea, nausea, and vomiting. MS:  Complains of muscle aches; 2 week h/o increased neck and  upper back pain has tried to contact drs in [place] no response. Derm:  Denies itching, lesion(s), and rash. Neuro:  Complains of headaches. Psych:  Complains of anxiety, easily angered, and easily tearful; denies suicidal thoughts/plans, thoughts of violence, and unusual visions or sounds; increased  and uncontrolled symptoms with the recent passing of her terminally ill mother. Endo:  Denies excessive hunger, excessive thirst, and excessive urination. Heme:  Denies abnormal bruising and bleeding. Allergy:  Complains of seasonal allergies; denies hives or rash and itching eyes.  Physical Exam  General:  Well-developed,UNDERWEIGHT APPEARING,in no acute distress; alert,appropriate and cooperative throughout examination. pT HAS BOTH ANXIETY AND  DEPRESSION HEENT: No facial asymmetry,  EOMI, No sinus tenderness, TM's Clear, oropharynx  pink and moist.   Chest: Clear to auscultation bilaterally.  CVS: S1, S2, No murmurs, No S3.   Abd: Soft, Nontender.  MS: decreased  ROM spine,aDEQUATE IN  hips, shoulders and knees.  Ext: No edema.   CNS: CN 2-12 intact, power tone and sensation normal throughout.   Skin: Intact, no visible lesions or rashes.  Psych: Good eye contact, normal affect.  Memory intact,   Impression & Recommendations:  Problem # 1:  NECK PAIN, CHRONIC (ICD-723.1) Assessment Deteriorated  Her updated medication list for this problem includes:    Hydrocodone-acetaminophen 10-325 Mg Tabs (Hydrocodone-acetaminophen) ..... One tablet 4 times daily as needed for back pain.  not to exceed 4 in 24 hrs.  Orders: Depo- Medrol 80mg  (J1040) Ketorolac-Toradol 15mg  ([id number]) Admin of Therapeutic Inj  intramuscular or subcutaneous (60454) Pain Clinic Referral (Pain)  Problem # 2:  DEPRESSION, CHRONIC (ICD-311) Assessment: Deteriorated  The following medications were removed from the medication list:    Sertraline Hcl 25 Mg Tabs (Sertraline hcl) .Marland Kitchen... Take 1 tablet by mouth once a day Her updated medication list for this problem includes:    Alprazolam 0.25 Mg Tabs (Alprazolam) .Marland Kitchen... Take 1 tab by mouth at bedtime  Problem # 3:  NICOTINE ADDICTION (ICD-305.1) Assessment: Improved  Encouraged smoking cessation and discussed different methods for smoking cessation.   Problem # 4:  HYPERLIPIDEMIA (ICD-272.4) Assessment: Comment Only  The following medications were removed from the medication list:    Simvastatin 10 Mg Tabs (Simvastatin) .Marland Kitchen... Take 1 tab by mouth at bedtime  Labs Reviewed: SGOT: 14 (04/09/2010)   SGPT: 11 (04/09/2010)   HDL:53 (04/09/2010), 55 (12/08/2009)  LDL:162 (04/09/2010), 200 (12/08/2009)  Chol:231 (04/09/2010), 280 (12/08/2009)  Trig:82 (04/09/2010), 127 (12/08/2009), low fat diet discussed and  encouraged  Problem # 5:  ESSENTIAL HYPERTENSION, BENIGN (ICD-401.1) Assessment: Deteriorated  The following medications were removed from the medication list:    Clonidine Hcl 0.3 Mg Tabs (Clonidine hcl) ..... One and one half tab by mouth once daily Her updated medication list for this problem includes:    Maxzide 75-50 Mg Tabs (Triamterene-hctz) .Marland Kitchen... Take 1 tablet by mouth once a day    Amlodipine Besylate 5 Mg Tabs (Amlodipine besylate) .Marland Kitchen... Take 1 tablet by mouth once a day  BP today: 160/70 Prior BP: 140/70 (04/08/2010)  Labs Reviewed: K+: 3.9 (04/09/2010) Creat: : 0.76 (04/09/2010)   Chol: 231 (04/09/2010)   HDL: 53 (04/09/2010)   LDL: 162 (04/09/2010)   TG: 82 (04/09/2010)  Complete Medication List: 1)  Nitroquick 0.4 Mg Subl (Nitroglycerin) .... One tab under tounge as needed (uad) 2)  Prilosec 20 Mg Cpdr (Omeprazole) .Marland Kitchen.. 1 by mouth first thing in the morning and 30 minutes before supper 3)  Alprazolam 0.25 Mg Tabs (Alprazolam) .... Take 1 tab by mouth at bedtime 4)  Hydrocodone-acetaminophen 10-325 Mg Tabs (Hydrocodone-acetaminophen) .... One tablet 4 times daily as needed for back pain.  not to exceed 4 in 24 hrs. 5)  Oyster Shell Calcium/d 500-400 Mg-unit Tabs (Calcium-vitamin d) .... Take 1 tablet  by mouth three times a day 6)  Vitamin D (ergocalciferol) 50000 Unit Caps (Ergocalciferol) .... One tab by mouth every week 7)  Carafate 1 Gm Tabs (Sucralfate) .... Take 1 tablet by mouth four times a day 8)  Maxzide 75-50 Mg Tabs (Triamterene-hctz) .... Take 1 tablet by mouth once a day 9)  Amlodipine Besylate 5 Mg Tabs (Amlodipine besylate) .... Take 1 tablet by mouth once a day  Patient Instructions: 1)  Please schedule a follow-up appointment in 6 weeks. 2)  Continue to stay off the nicotine  3)  Blood pressure is still high new med is amlodipine 5 mg , one daily, continue the maxzide as before 4)  We will try to get you an appt at [facility] Prescriptions: AMLODIPINE  BESYLATE 5 MG TABS (AMLODIPINE BESYLATE) Take 1 tablet by mouth once a day  #30 x 3   Entered and Authorized by:   Syliva Overman MD   Signed by:   Syliva Overman MD on 07/13/2010   Method used:   Electronically to        [facility] (retail)       [address] [place], [place]  [address]       Ph: [phone]       Fax: [phone]   RxID:   [id number]    Medication Administration  Injection # 1:    Medication: Depo- Medrol 80mg     Diagnosis: NECK PAIN, CHRONIC (ICD-723.1)    Route: IM    Site: LUOQ gluteus    Exp Date: 02/2011    Lot #: [id number]    Mfr: Pharmacia    Comments: 80mg  given     Patient tolerated injection without complications    Given by: Everitt Amber LPN (July 13, 2010 11:49 AM)  Injection # 2:    Medication: Ketorolac-Toradol 15mg     Diagnosis: NECK PAIN, CHRONIC (ICD-723.1)    Route: IM    Site: LUOQ gluteus    Exp Date: 01/2011    Lot #: [id number]     Mfr: [email or web address]    Comments: 60mg  given     Patient tolerated injection without complications    Given by: Everitt Amber LPN (July 13, 2010 11:50 AM)  Orders Added: 1)  Est. Patient Level IV [53664] 2)  Depo- Medrol 80mg  [J1040] 3)  Ketorolac-Toradol 15mg  [J1885] 4)  Admin of Therapeutic Inj  intramuscular or subcutaneous [96372] 5)  Pain Clinic Referral [Pain]

## 2010-12-21 NOTE — Letter (Signed)
Summary: Letter  Letter   Imported By: Lind Guest 04/13/2010 08:07:46  _____________________________________________________________________  External Attachment:    Type:   Image     Comment:   External Document

## 2010-12-21 NOTE — Assessment & Plan Note (Signed)
Summary: leg and back pain - room 1   Vital Signs:  Patient profile:   55 year old female Menstrual status:  postmenopausal Height:      62 inches Weight:      122.75 pounds BMI:     22.53 O2 Sat:      100 % on Room air Pulse rate:   53 / minute Resp:     16 per minute BP sitting:   122 / 58  (left arm)  Vitals Entered By: Adella Hare LPN (January 14, 2010 9:20 AM) CC: lower back and leg pain and weak, sore throat Is Patient Diabetic? No Pain Assessment Patient in pain? yes     Location: back and legs Intensity: 10 Type: aching Onset of pain  Constant   Primary Provider:  Dr. Lodema Hong  CC:  lower back and leg pain and weak and sore throat.  History of Present Illness: Seeing Dr Ophelia Charter re: LBP.  c/o pain & weakness low back & down both legs.   Had epidural about 2 wks ago.  Saw Dr Ophelia Charter yesterday & is scheduling another epidural for her.  Pt states she is taking her hydrocodone three times a day & it is not controlling her pain.  Denies trauma or aggrevating acitivities etc.  Pt is here for help with pain mgmt.  No lack of control of bowel or bladder.  She also noticed in the last couple of days that she has a tender lump in her Lt neck under her jaw.  The area is also sore when she swallows.  She denies any fever.  Has had a little nasal congestion but no PND or sinus pressure. Her daughter has been ill recently too.    Current Medications (verified): 1)  Clonidine Hcl 0.3 Mg  Tabs (Clonidine Hcl) .... One and One Half Tab By Mouth Once Daily 2)  Nitroquick 0.4 Mg  Subl (Nitroglycerin) .... One Tab Under Tounge As Needed (Uad) 3)  Prilosec 20 Mg Cpdr (Omeprazole) .Marland Kitchen.. 1 By Mouth First Thing in The Morning and 30 Minutes Before Supper 4)  Alprazolam 0.25 Mg Tabs (Alprazolam) .... Take 1 Tab By Mouth At Bedtime 5)  Hydrochlorothiazide 12.5 Mg Caps (Hydrochlorothiazide) .... Take 1 Tablet By Mouth Once A Day 6)  Klor-Con M20 20 Meq Cr-Tabs (Potassium Chloride Crys Cr) ....  Take 1 Tablet By Mouth Once A Day 7)  Hydrocodone-Acetaminophen 10-325 Mg Tabs (Hydrocodone-Acetaminophen) .... One Tablet Three Times Daily For 3 Days, Then One Twice Daily 8)  Oyster Shell Calcium/d 500-400 Mg-Unit Tabs (Calcium-Vitamin D) .... Take 1 Tablet By Mouth Three Times A Day 9)  Vitamin D (Ergocalciferol) 50000 Unit Caps (Ergocalciferol) .... One Tab By Mouth Every Week 10)  Lipitor 40 Mg Tabs (Atorvastatin Calcium) .... One Tab By Mouth Qhs  Allergies (verified): 1)  ! Asa PMH-FH-SH reviewed for relevance  Review of Systems General:  Denies chills, fatigue, and fever. ENT:  Complains of nasal congestion; denies earache, postnasal drainage, sinus pressure, and sore throat. CV:  Denies chest pain or discomfort and palpitations. Resp:  Denies cough and shortness of breath. MS:  Complains of low back pain and muscle weakness; denies joint pain. Allergy:  Denies itching eyes and sneezing.  Physical Exam  General:  alert, well-developed, well-nourished, and appropriate dress.  appears uncomfortable Head:  Normocephalic and atraumatic without obvious abnormalities. No apparent alopecia or balding. Ears:  External ear exam shows no significant lesions or deformities.  Otoscopic examination reveals clear canals, tympanic  membranes are intact bilaterally without bulging, retraction, inflammation or discharge. Hearing is grossly normal bilaterally. Nose:  External nasal examination shows no deformity or inflammation. Nasal mucosa are pink and moist without lesions or exudates. Mouth:  Oral mucosa and oropharynx without lesions or exudates.  Teeth in good repair. Neck:  No deformities, masses, or tenderness noted. Lungs:  Normal respiratory effort, chest expands symmetrically. Lungs are clear to auscultation, no crackles or wheezes. Heart:  Normal rate and regular rhythm. S1 and S2 normal without gallop, murmur, click, rub or other extra sounds. Msk:  Scars on back from prev surgeries  noted.   Cervical Nodes:  1 tender Lt submand node noted, < 1cm, smooth & mobile.  Remainder of cervical nodes neg. Psych:  Cognition and judgment appear intact. Alert and cooperative with normal attention span and concentration. No apparent delusions, illusions, hallucinations   Impression & Recommendations:  Problem # 1:  BACK PAIN, CHRONIC (ICD-724.5) Assessment Deteriorated Pt to continue seeing orthopod, & follow thru with epidural as planned. Discussed case with Dr. Lodema Hong. Recommend Duragesic patch but pt states this caused nausea before so she doesn't want to try again. Will increase her hydrocodone & refer her to pain clinic.  Her updated medication list for this problem includes:    Hydrocodone-acetaminophen 10-325 Mg Tabs (Hydrocodone-acetaminophen) ..... One tablet 4 times daily as needed for back pain.  not to exceed 4 in 24 hrs.  Problem # 2:  CERVICAL LYMPHADENOPATHY (ICD-785.6) Discussed role of lymph nodes. With lack of other syptoms, fever etc recommend wait & watch.  Discussed that the lymph node should go down within 1-2 wks.  If persists > 2 wks needs a follow up appt for further eval.  Problem # 3:  ESSENTIAL HYPERTENSION, BENIGN (ICD-401.1) Assessment: Improved Continue current medications.  Her updated medication list for this problem includes:    Clonidine Hcl 0.3 Mg Tabs (Clonidine hcl) ..... One and one half tab by mouth once daily    Hydrochlorothiazide 12.5 Mg Caps (Hydrochlorothiazide) .Marland Kitchen... Take 1 tablet by mouth once a day  BP today: 122/58 Prior BP: 140/70 (11/30/2009)  Labs Reviewed: K+: 3.8 (12/14/2009) Creat: : 0.79 (12/09/2009)   Chol: 280 (12/08/2009)   HDL: 55 (12/08/2009)   LDL: 200 (12/08/2009)   TG: 127 (12/08/2009)  Complete Medication List: 1)  Clonidine Hcl 0.3 Mg Tabs (Clonidine hcl) .... One and one half tab by mouth once daily 2)  Nitroquick 0.4 Mg Subl (Nitroglycerin) .... One tab under tounge as needed (uad) 3)  Prilosec 20 Mg  Cpdr (Omeprazole) .Marland Kitchen.. 1 by mouth first thing in the morning and 30 minutes before supper 4)  Alprazolam 0.25 Mg Tabs (Alprazolam) .... Take 1 tab by mouth at bedtime 5)  Hydrochlorothiazide 12.5 Mg Caps (Hydrochlorothiazide) .... Take 1 tablet by mouth once a day 6)  Klor-con M20 20 Meq Cr-tabs (Potassium chloride crys cr) .... Take 1 tablet by mouth once a day 7)  Hydrocodone-acetaminophen 10-325 Mg Tabs (Hydrocodone-acetaminophen) .... One tablet 4 times daily as needed for back pain.  not to exceed 4 in 24 hrs. 8)  Oyster Shell Calcium/d 500-400 Mg-unit Tabs (Calcium-vitamin d) .... Take 1 tablet by mouth three times a day 9)  Vitamin D (ergocalciferol) 50000 Unit Caps (Ergocalciferol) .... One tab by mouth every week 10)  Lipitor 40 Mg Tabs (Atorvastatin calcium) .... One tab by mouth qhs  Patient Instructions: 1)  We are increasing your pain medication to 4 times daily as needed. 2)  Continue seeing  Dr. Ophelia Charter. 3)  We are going to refer you to a pain clinic for your pain medication management. Prescriptions: HYDROCODONE-ACETAMINOPHEN 10-325 MG TABS (HYDROCODONE-ACETAMINOPHEN) one tablet 4 times daily as needed for back pain.  Not to exceed 4 in 24 hrs.  #120 x 0   Entered and Authorized by:   Esperanza Sheets PA   Signed by:   Esperanza Sheets PA on 01/14/2010   Method used:   Printed then faxed to ...       [facility] (retail)       [address]       [place], [place]  [address]       Ph: [phone]       Fax: [phone]   RxID:   [id number]

## 2010-12-21 NOTE — Op Note (Signed)
Summary: Operative Report  Operative Report   Imported By: Lind Guest 11/30/2009 16:43:30  _____________________________________________________________________  External Attachment:    Type:   Image     Comment:   External Document

## 2010-12-21 NOTE — Assessment & Plan Note (Signed)
Summary: OFFICE VISIT   Vital Signs:  Patient profile:   55 year old female Menstrual status:  postmenopausal Height:      62 inches Weight:      123.75 pounds BMI:     22.72 O2 Sat:      95 % Pulse rate:   66 / minute Pulse rhythm:   regular Resp:     16 per minute BP sitting:   140 / 70 Cuff size:   regular  Vitals Entered By: Everitt Amber (November 30, 2009 1:16 PM) CC: c/o pain in her bones, hurting all over Pain Assessment Patient in pain? yes     Location: all over Intensity: 8 Type: aching Onset of pain  a month ago   Primary Care Provider:  Dr. Lodema Hong  CC:  c/o pain in her bones and hurting all over.  History of Present Illness: Pt c/o unconmtrolled and persistent generalised joint pains. She had neck surgery and states she has been told she may need rept back surgery. She recently had an mRI of her low back and has a f/u with the orthopod who operated in her neck regarding this. She has had back surgery at [facility] in the past, and has some reservations about an orthopod operating on her back, I advised she return to [facility] if she needs rept back surgery. She denies any recent fever or chills, head or chest congestion. Shedenies dysuria, frequency or incontinence. she request s a dexa, she is convinced that her bones are osteoperotic and wants definitive treatment if this is so. She ahs not been consitently taking calcium but states she will start to do so.  Preventive Screening-Counseling & Management  Alcohol-Tobacco     Smoking Status: current     Smoking Cessation Counseling: yes     Packs/Day: <0.25  Allergies: 1)  ! Asa  Social History: Packs/Day:  <0.25  Review of Systems      See HPI Eyes:  Denies blurring and discharge. CV:  Denies chest pain or discomfort, difficulty breathing while lying down, palpitations, and swelling of feet. GI:  Denies abdominal pain, constipation, diarrhea, nausea, and vomiting. MS:  Complains of joint pain,  low back pain, mid back pain, and stiffness; c/o uncontrolled pain, reporrts numbness and tingling ion toes and fingers, staes  right knee is unsteady and the left  knee also is a prob, hears her bones pop, had MRI this past Friday will see Dr Ophelia Charter this wednesday for possible back surgery.. Neuro:  Complains of weakness; denies seizures and sensation of room spinning. Psych:  Complains of anxiety and depression; denies suicidal thoughts/plans, thoughts of violence, and unusual visions or sounds.  Physical Exam  General:  Well-developed,well-nourished,in no acute distress; alert,appropriate and cooperative throughout examination HEENT: No facial asymmetry,  EOMI, No sinus tenderness, TM's Clear, oropharynx  pink and moist.   Chest: Clear to auscultation bilaterally.  CVS: S1, S2, No murmurs, No S3.   Abd: Soft, Nontender.  MS: decreased ROM spine, hips, shoulders and knees.  Ext: No edema.   CNS: CN 2-12 intact, power tone and sensation normal throughout.   Skin: Intact, no visible lesions or rashes.  Psych: Good eye contact, normal affect.  Memory intact, not anxious or depressed appearing.    Impression & Recommendations:  Problem # 1:  OSTEOPOROSIS (ICD-733.00) Assessment Comment Only  Orders: T-Vitamin D (25-Hydroxy) ([id number]) Radiology Referral (Radiology)  Problem # 2:  DEPRESSION, CHRONIC (ICD-311) Assessment: Improved  The following medications were removed  from the medication list:    Venlafaxine Hcl 75 Mg Tabs (Venlafaxine hcl) .Marland Kitchen... Take 1 tablet by mouth once a day Her updated medication list for this problem includes:    Alprazolam 0.25 Mg Tabs (Alprazolam) .Marland Kitchen... Take 1 tab by mouth at bedtime  Problem # 3:  ESSENTIAL HYPERTENSION, BENIGN (ICD-401.1) Assessment: Improved  Her updated medication list for this problem includes:    Clonidine Hcl 0.3 Mg Tabs (Clonidine hcl) ..... One and one half tab by mouth once daily    Hydrochlorothiazide 12.5 Mg Caps  (Hydrochlorothiazide) .Marland Kitchen... Take 1 tablet by mouth once a day  BP today: 140/70 Prior BP: 160/72 (10/19/2009)  Labs Reviewed: K+: 3.9 (05/15/2009) Creat: : 0.76 (05/15/2009)   Chol: 249 (02/17/2009)   HDL: 55 (02/17/2009)   LDL: 175 (02/17/2009)   TG: 95 (02/17/2009)  Problem # 4:  HYPERLIPIDEMIA (ICD-272.4) Assessment: Comment Only  Orders: T-Lipid Profile ([id number])  Labs Reviewed: SGOT: 11 (05/15/2009)   SGPT: 8 (05/15/2009)   HDL:55 (02/17/2009), 56 (01/04/2007)  LDL:175 (02/17/2009), 169 (01/04/2007)  Chol:249 (02/17/2009), 246 (01/04/2007)  Trig:95 (02/17/2009), 106 (01/04/2007)  Problem # 5:  NICOTINE ADDICTION (ICD-305.1) Assessment: Unchanged  Encouraged smoking cessation and discussed different methods for smoking cessation.   Complete Medication List: 1)  Clonidine Hcl 0.3 Mg Tabs (Clonidine hcl) .... One and one half tab by mouth once daily 2)  Nitroquick 0.4 Mg Subl (Nitroglycerin) .... One tab under tounge as needed (uad) 3)  Prilosec 20 Mg Cpdr (Omeprazole) .Marland Kitchen.. 1 by mouth first thing in the morning and 30 minutes before supper 4)  Alprazolam 0.25 Mg Tabs (Alprazolam) .... Take 1 tab by mouth at bedtime 5)  Hydrochlorothiazide 12.5 Mg Caps (Hydrochlorothiazide) .... Take 1 tablet by mouth once a day 6)  Klor-con M20 20 Meq Cr-tabs (Potassium chloride crys cr) .... Take 1 tablet by mouth once a day 7)  Hydrocodone-acetaminophen 10-325 Mg Tabs (Hydrocodone-acetaminophen) .... One tablet three times daily for 3 days, then one twice daily 8)  Oyster Shell Calcium/d 500-400 Mg-unit Tabs (Calcium-vitamin d) .... Take 1 tablet by mouth three times a day  Patient Instructions: 1)  cPE in 8 weeks. 2)  Tobacco is very bad for your health and your loved ones! You Should stop smoking!. 3)  Stop Smoking Tips: Choose a Quit date. Cut down before the Quit date. decide what you will do as a substitute when you feel the urge to smoke(gum,toothpick,exercise). 4)  Lipid Panel  prior to visit, ICD-9:  fasting asap, vit d level also 5)  Schedule your mammogram.We will its past due also a bone density scan. 6)  pLS follow a low fat diet. 7)  you need to take calcium with vit d daily Prescriptions: OYSTER SHELL CALCIUM/D 500-400 MG-UNIT TABS (CALCIUM-VITAMIN D) Take 1 tablet by mouth three times a day  #90 x 11   Entered and Authorized by:   Syliva Overman MD   Signed by:   Syliva Overman MD on 11/30/2009   Method used:   Electronically to        [facility] (retail)       [address] [place], [place]  [address]       Ph: [phone]       Fax: [phone]   RxID:   [id number]

## 2010-12-21 NOTE — Progress Notes (Signed)
  Phone Note Call from Patient   Caller: Patient Summary of Call: does patient qualify for pcs services?  Initial call taken by: Adella Hare LPN,  March 15, 2010 4:15 PM  Follow-up for Phone Call        yes back paIN, DISC DISEASE  Follow-up by: Syliva Overman MD,  March 15, 2010 5:20 PM  Additional Follow-up for Phone Call Additional follow up Details #1::        ccme form completed and faxed Additional Follow-up by: Adella Hare LPN,  March 17, 2010 10:00 AM

## 2010-12-21 NOTE — Progress Notes (Signed)
Summary: meds  Phone Note Call from Patient   Summary of Call: needs her hydrcodone send to [facility]ot Initial call taken by: Lind Guest,  September 15, 2010 11:51 AM  Follow-up for Phone Call        HAD BEEN FILLED AND DELIVERED Follow-up by: Adella Hare LPN,  September 17, 2010 3:15 PM

## 2010-12-21 NOTE — Progress Notes (Signed)
Summary: pain med  Phone Note Call from Patient   Summary of Call: needs refill on pain medication. [phone] Initial call taken by: Rudene Anda,  Apr 13, 2010 2:22 PM  Follow-up for Phone Call        Rx Called In Follow-up by: Adella Hare LPN,  Apr 13, 2010 2:30 PM    New/Updated Medications: HYDROCODONE-ACETAMINOPHEN 10-325 MG TABS (HYDROCODONE-ACETAMINOPHEN) one tablet 4 times daily as needed for back pain.  Not to exceed 4 in 24 hrs. Prescriptions: HYDROCODONE-ACETAMINOPHEN 10-325 MG TABS (HYDROCODONE-ACETAMINOPHEN) one tablet 4 times daily as needed for back pain.  Not to exceed 4 in 24 hrs.  #120 x 1   Entered by:   Adella Hare LPN   Authorized by:   Syliva Overman MD   Signed by:   Adella Hare LPN on 84/69/6295   Method used:   Printed then faxed to ...       [facility] (retail)       [address]       [place], [place]  [address]       Ph: [phone]       Fax: [phone]   RxID:   [id number]

## 2010-12-21 NOTE — Progress Notes (Signed)
  Phone Note Call from Patient   Caller: Patient Summary of Call: patient called complains of leg pain  advised appt tomorrow morning with pa  patient agrees Initial call taken by: Adella Hare LPN,  January 13, 2010 4:49 PM

## 2010-12-21 NOTE — Progress Notes (Signed)
Summary: APPT.  Phone Note Call from Patient   Summary of Call: WANTED YOU  KNOW THAT THE LAST APPT. WAS SUPPOSED TO HAVE BEEN CANCELLED. PLEASE DO COUNT HER AS A NO SHOW Initial call taken by: Lind Guest,  November 30, 2009 2:40 PM  Follow-up for Phone Call        noted  Follow-up by: Syliva Overman MD,  December 02, 2009 12:01 AM

## 2010-12-21 NOTE — Assessment & Plan Note (Signed)
Summary: BP CK  Nurse Visit   Allergies: 1)  ! Asa 2)  ! Lipitor (Atorvastatin) recheck BP is 180/80. Pt states sjhehhas no energy, eyes look different, having black dots in her eyes since the course of the day, she has no localised weakness or numbness, and stomach burning, pt advised to go to the Ed

## 2010-12-21 NOTE — Assessment & Plan Note (Signed)
Summary: office visit   Vital Signs:  Patient profile:   55 year old female Menstrual status:  postmenopausal Height:      62 inches Weight:      119.50 pounds BMI:     21.94 O2 Sat:      98 % on Room air Pulse rate:   76 / minute Pulse rhythm:   regular Resp:     16 per minute BP sitting:   170 / 80  (right arm) Cuff size:   regular  Vitals Entered By: Mauricia Area, CMA  O2 Flow:  Room air CC: Follow up. Patient states left sciatica is bothering her. Comments Patient did not bring meds.  Also states an injection recieved at her last visit caused a reaction, she was itching from the inside out.   Primary Care Provider:  Dr. Lodema Hong  CC:  Follow up. Patient states left sciatica is bothering her..  History of Present Illness: Reports  that she is doing better than at the last visit. She is coming to terms with the recent loss of her ailing mother. Denies recent fever or chills. Denies sinus pressure, nasal congestion , ear pain or sore throat. Denies chest congestion, or cough productive of sputum. Denies chest pain, palpitations, PND, orthopnea or leg swelling. Denies abdominal pain, nausea, vomitting, diarrhea or constipation. Denies change in bowel movements or bloody stool. Denies dysuria , frequency, incontinence or hesitancy. Reports continued , and uncontrolled LBP radiating down left leg. She has not received the epidural she was aiming to, because the provider who was there had changed , and she was afraid. Denies headaches, vertigo, seizures.  Denies  rash, lesions, or itch.     Preventive Screening-Counseling & Management  Alcohol-Tobacco     Smoking Cessation Counseling: yes  Allergies: 1)  ! Asa 2)  ! Lipitor (Atorvastatin) 3)  ! Sulfa  Past History:  Family history reviewed for relevance to current acute and chronic problems.  Family History: Reviewed history from 07/13/2010 and no changes required. Mother- Hypertension, arthritis, esrd, mom  died at age 55  in 04/04/2010 Father- CVA, hypertension  deceased at age 74 stroke 4 sisters- hypertension, one with macular degeneration 5 brothers- 2 living healthy, i brother deceased  in his 53's AIDS, one brother shot , the other was alcoholic No FH of Colon Cancer or polyps Brother has kidney stones.  Review of Systems      See HPI General:  Complains of fatigue and sleep disorder. Eyes:  Denies discharge and red eye. Psych:  Complains of anxiety, depression, and irritability; denies mental problems, sense of great danger, suicidal thoughts/plans, thoughts of violence, and unusual visions or sounds. Endo:  Denies cold intolerance, excessive thirst, excessive urination, and heat intolerance. Heme:  Denies abnormal bruising and bleeding. Allergy:  Complains of seasonal allergies.  Physical Exam  General:  Well-developed,well-nourished,in no acute distress; alert,appropriate and cooperative throughout examination HEENT: No facial asymmetry,  EOMI, No sinus tenderness, TM's Clear, oropharynx  pink and moist.   Chest: Clear to auscultation bilaterally.  CVS: S1, S2, No murmurs, No S3.   Abd: Soft, Nontender.  MS: decreased  ROM spine, hips, shoulders and knees.  Ext: No edema.   CNS: CN 2-12 intact, power tone and sensation normal throughout.   Skin: Intact, no visible lesions or rashes.  Psych: Good eye contact, normal affect.  Memory intact, not anxious or depressed appearing.    Impression & Recommendations:  Problem # 1:  NECK PAIN, CHRONIC (ICD-723.1)  Assessment Unchanged  Her updated medication list for this problem includes:    Hydrocodone-acetaminophen 10-325 Mg Tabs (Hydrocodone-acetaminophen) ..... One tablet 4 times daily as needed for back pain.  not to exceed 4 in 24 hrs.  Problem # 2:  DEPRESSION, CHRONIC (ICD-311) Assessment: Improved  The following medications were removed from the medication list:    Alprazolam 0.25 Mg Tabs (Alprazolam) .Marland Kitchen... Take 1 tab by  mouth at bedtime  Problem # 3:  NICOTINE ADDICTION (ICD-305.1) Assessment: Improved  Problem # 4:  HYPERLIPIDEMIA (ICD-272.4) Assessment: Comment Only  Orders: T-Lipid Profile ([id number]) Low fat dietdiscussed and encouraged Pt intolerant of statins Labs Reviewed: SGOT: 14 (04/09/2010)   SGPT: 11 (04/09/2010)   HDL:53 (04/09/2010), 55 (12/08/2009)  LDL:162 (04/09/2010), 200 (12/08/2009)  Chol:231 (04/09/2010), 280 (12/08/2009)  Trig:82 (04/09/2010), 127 (12/08/2009)  Problem # 5:  ESSENTIAL HYPERTENSION, BENIGN (ICD-401.1) Assessment: Deteriorated  The following medications were removed from the medication list:    Maxzide 75-50 Mg Tabs (Triamterene-hctz) .Marland Kitchen... Take 1 tablet by mouth once a day Her updated medication list for this problem includes:    Amlodipine Besylate 5 Mg Tabs (Amlodipine besylate) .Marland Kitchen... Take 1 tablet by mouth once a day    Clonidine Hcl 0.1 Mg Tabs (Clonidine hcl) .Marland Kitchen... Take 1 tab by mouth at bedtime  Orders: T-Basic Metabolic Panel [id number])  BP today: 170/80 Prior BP: 160/70 (07/13/2010)  Labs Reviewed: K+: 3.9 (04/09/2010) Creat: : 0.76 (04/09/2010)   Chol: 231 (04/09/2010)   HDL: 53 (04/09/2010)   LDL: 162 (04/09/2010)   TG: 82 (04/09/2010)  Complete Medication List: 1)  Hydrocodone-acetaminophen 10-325 Mg Tabs (Hydrocodone-acetaminophen) .... One tablet 4 times daily as needed for back pain.  not to exceed 4 in 24 hrs. 2)  Vitamin D (ergocalciferol) 50000 Unit Caps (Ergocalciferol) .... One tab by mouth every week 3)  Amlodipine Besylate 5 Mg Tabs (Amlodipine besylate) .... Take 1 tablet by mouth once a day 4)  Prednisone (pak) 10 Mg Tabs (Prednisone) .... Use as directed 5)  Dexilant 60 Mg Cpdr (Dexlansoprazole) .... Take 1 capsule by mouth once a day 6)  Clonidine Hcl 0.1 Mg Tabs (Clonidine hcl) .... Take 1 tab by mouth at bedtime  Other Orders: T-Vitamin D (25-Hydroxy) [phone]) Influenza Vaccine MCR [phone]) Radiology Referral  (Radiology)  Patient Instructions: 1)  F/U mid January. 2)  Vit D, chem 7 and lipids fasdting in Jan 3)  New meds as discussed. 4)  Tobacco is very bad for your health and your loved ones! You Should stop smoking!. 5)  Stop Smoking Tips: Choose a Quit date. Cut down before the Quit date. decide what you will do as a substitute when you feel the urge to smoke(gum,toothpick,exercise). 6)  [place] asap Prescriptions: CLONIDINE HCL 0.1 MG TABS (CLONIDINE HCL) Take 1 tab by mouth at bedtime  #30 x 3   Entered and Authorized by:   Syliva Overman MD   Signed by:   Syliva Overman MD on 08/30/2010   Method used:   Electronically to        [facility] (retail)       [address]       [place], [place]  [address]       Ph: [phone]       Fax: [phone]   RxID:   [id number] DEXILANT 60 MG CPDR (DEXLANSOPRAZOLE) Take 1 capsule by mouth once a day  #10 x 0   Entered and Authorized by:  Syliva Overman MD   Signed by:   Syliva Overman MD on 08/30/2010   Method used:   Samples Given   RxID:   [id number] PREDNISONE (PAK) 10 MG TABS (PREDNISONE) Use as directed  #21 x 0   Entered and Authorized by:   Syliva Overman MD   Signed by:   Syliva Overman MD on 08/30/2010   Method used:   Electronically to        [facility] (retail)       [address]       [place], [place]  [address]       Ph: [phone]       Fax: [phone]   RxID:   [id number]    Influenza Vaccine    Vaccine Type: Fluvax MCR    Site: right deltoid    Mfr: novartis    Dose: 0.5 ml    Route: IM    Given by: Adella Hare LPN    Exp. Date: 03/2011    Lot #: [id number]    VIS given: 06/15/10 version given August 30, 2010.

## 2010-12-21 NOTE — Letter (Signed)
Summary: 1st missed letter  1st missed letter   Imported By: Lind Guest 10/13/2010 17:25:51  _____________________________________________________________________  External Attachment:    Type:   Image     Comment:   External Document

## 2010-12-21 NOTE — Progress Notes (Signed)
Summary: [facility]   Imported By: Lind Guest 04/07/2010 08:32:55  _____________________________________________________________________  External Attachment:    Type:   Image     Comment:   External Document

## 2010-12-23 NOTE — Letter (Signed)
Summary: ENT  ENT   Imported By: Lind Guest 12/09/2010 13:37:12  _____________________________________________________________________  External Attachment:    Type:   Image     Comment:   External Document

## 2010-12-23 NOTE — Progress Notes (Addendum)
Summary: referral  Phone Note Call from Patient   Summary of Call: pt would like to get a referral for dr. Gerilyn Pilgrim. chronic pain. can't find a Librarian, academic.    [phone] Initial call taken by: Rudene Anda,  December 06, 2010 3:38 PM     Appended Document: referral Sent referral to dr. Gerilyn Pilgrim office. they will call pt with appt. pt was called and left message on this.

## 2010-12-23 NOTE — Assessment & Plan Note (Signed)
Summary: BP   Vital Signs:  Patient profile:   55 year old female Menstrual status:  postmenopausal Height:      62 inches Weight:      122.50 pounds BMI:     22.49 O2 Sat:      97 % on Room air Pulse rate:   71 / minute Pulse rhythm:   regular Resp:     16 per minute BP sitting:   148 / 80  (left arm)  Vitals Entered By: Adella Hare LPN (November 10, 2010 10:16 AM)  O2 Flow:  Room air CC: follow-up visit- complains of left leg pain Is Patient Diabetic? No Comments did not bring meds to ov   Primary Care Provider:  Dr. Lodema Hong  CC:  follow-up visit- complains of left leg pain.  History of Present Illness: Pt in for blood pressure review. She denies any current fever or chills. she c/o left ear pain, and was recently seen in the Ed for similar complaint. She also reports hearing loss, which is new. She denies sinus pressure, sore throat or nasaldrainage.She does report nasal congestion. she denies abdominal pain, nausea, vomitting or diarreah. she continues to experience chronic back pain, and expresses concern that she will have difficulty finding a new provider to prescribe chronic pain meds fo her.i recommended the local pain specialists. She is unhappy and not quite understanding why failure to keep appts is a cause fo termination. At no time did I discuss the underlying major problem involving her use of profanity to nursing staff, nor did she mention it.   Allergies (verified): 1)  ! Asa 2)  ! Lipitor (Atorvastatin) 3)  ! Sulfa  Review of Systems      See HPI General:  Complains of fatigue. Eyes:  Denies discharge. ENT:  Complains of decreased hearing, nasal congestion, and sinus pressure; right ear decreased hearing. CV:  Denies chest pain or discomfort, palpitations, and swelling of feet. Resp:  Complains of cough, sputum productive, and wheezing; cough sputum mainly clear. GI:  Denies abdominal pain, constipation, diarrhea, nausea, and vomiting. GU:   Denies dysuria and urinary frequency. MS:  Complains of joint pain, low back pain, mid back pain, and stiffness. Allergy:  Complains of seasonal allergies.  Physical Exam  General:  Well-developed,well-nourished,in no acute distress; alert,appropriate and cooperative throughout examination HEENT: No facial asymmetry,  EOMI,left maxillary  sinus tenderness, left TM erythematous with poor leght reflex, oropharynx  pink and moist.   Chest: Clear to auscultation bilaterally.  CVS: S1, S2, No murmurs, No S3.   Abd: Soft, Nontender.  MS: decreased ROM spine,adequate in  hips, shoulders and knees.  Ext: No edema.   CNS: CN 2-12 intact, power tone and sensation normal throughout.   Skin: Intact, no visible lesions or rashes.  Psych: Good eye contact, normal affect.  Memory intact, not anxious or depressed appearing.    Impression & Recommendations:  Problem # 1:  OTITIS MEDIA, LEFT (ICD-382.9) Assessment Comment Only  Her updated medication list for this problem includes:    Penicillin V Potassium 500 Mg Tabs (Penicillin v potassium) .Marland Kitchen... Take 1 tablet by mouth three times a day  Orders: Medicare Electronic Prescription [phone]) ENT Referral (ENT)  Problem # 2:  NECK PAIN, CHRONIC (ICD-723.1) Assessment: Unchanged  Her updated medication list for this problem includes:    Hydrocodone-acetaminophen 10-325 Mg Tabs (Hydrocodone-acetaminophen) ..... One tablet 4 times daily as needed for back pain.  not to exceed 4 in 24 hrs.  Problem #  3:  ESSENTIAL HYPERTENSION, BENIGN (ICD-401.1) Assessment: Improved  Her updated medication list for this problem includes:    Amlodipine Besylate 5 Mg Tabs (Amlodipine besylate) .Marland Kitchen... Take 1 tablet by mouth once a day    Clonidine Hcl 0.1 Mg Tabs (Clonidine hcl) .Marland Kitchen... Take 1 tab by mouth at bedtime  BP today: 148/80 Prior BP: 170/80 (08/30/2010)  Labs Reviewed: K+: 3.9 (04/09/2010) Creat: : 0.76 (04/09/2010)   Chol: 231 (04/09/2010)   HDL: 53  (04/09/2010)   LDL: 162 (04/09/2010)   TG: 82 (04/09/2010)  Problem # 4:  HYPERLIPIDEMIA (ICD-272.4) Assessment: Comment Only  Labs Reviewed: SGOT: 14 (04/09/2010)   SGPT: 11 (04/09/2010)   HDL:53 (04/09/2010), 55 (12/08/2009)  LDL:162 (04/09/2010), 200 (12/08/2009)  Chol:231 (04/09/2010), 280 (12/08/2009)  Trig:82 (04/09/2010), 127 (12/08/2009) Low fat dietdiscussed and encouraged pt has been intolerant of statins  Complete Medication List: 1)  Hydrocodone-acetaminophen 10-325 Mg Tabs (Hydrocodone-acetaminophen) .... One tablet 4 times daily as needed for back pain.  not to exceed 4 in 24 hrs. 2)  Vitamin D (ergocalciferol) 50000 Unit Caps (Ergocalciferol) .... One tab by mouth every week 3)  Amlodipine Besylate 5 Mg Tabs (Amlodipine besylate) .... Take 1 tablet by mouth once a day 4)  Dexilant 60 Mg Cpdr (Dexlansoprazole) .... Take 1 capsule by mouth once a day 5)  Clonidine Hcl 0.1 Mg Tabs (Clonidine hcl) .... Take 1 tab by mouth at bedtime 6)  Penicillin V Potassium 500 Mg Tabs (Penicillin v potassium) .... Take 1 tablet by mouth three times a day 7)  Tessalon Perles 100 Mg Caps (Benzonatate) .... Take 1 capsule by mouth three times a day 8)  Fluconazole 150 Mg Tabs (Fluconazole) .... Take 1 tablet by mouth once a day as needed  Patient Instructions: 1)  You are being treated for sinusitis and left otitis  media. 2)  Your BP is 148/80, pls stay on your current medications. 3)  You are being referred to ENT for follow up. 4)  I wish you all the best . 5)  You can call Dr.Bethea or Dr  Gerilyn Pilgrim re pain management. 6)  You may contact the hospital for a list of providers in the area for your medical care Prescriptions: FLUCONAZOLE 150 MG TABS (FLUCONAZOLE) Take 1 tablet by mouth once a day as needed  #3 x 0   Entered and Authorized by:   Syliva Overman MD   Signed by:   Syliva Overman MD on 11/10/2010   Method used:   Electronically to        [facility] (retail)        [address]       [place], [place]  [address]       Ph: [phone]       Fax: [phone]   RxID:   [id number] TESSALON PERLES 100 MG CAPS (BENZONATATE) Take 1 capsule by mouth three times a day  #30 x 0   Entered and Authorized by:   Syliva Overman MD   Signed by:   Syliva Overman MD on 11/10/2010   Method used:   Electronically to        [facility] (retail)       [address]       [place], [place]  [address]       Ph: [phone]       Fax: [phone]   RxID:  [id number] PENICILLIN V POTASSIUM 500 MG TABS (PENICILLIN V POTASSIUM) Take 1 tablet by mouth three times a day  #30 x 0   Entered and Authorized by:   Syliva Overman MD   Signed by:   Syliva Overman MD on 11/10/2010   Method used:   Electronically to        [facility] (retail)       [address]       [place], [place]  [address]       Ph: [phone]       Fax: [phone]   RxID:   [id number]    Orders Added: 1)  Est. Patient Level III [38756] 2)  Medicare Electronic Prescription [G8553] 3)  ENT Referral [ENT]

## 2010-12-23 NOTE — Letter (Signed)
Summary: to pharmacist  to pharmacist   Imported By: Lind Guest 11/11/2010 08:06:40  _____________________________________________________________________  External Attachment:    Type:   Image     Comment:   External Document

## 2010-12-24 NOTE — Letter (Signed)
Summary: med review  med review   Imported By: Lind Guest 08/30/2010 14:41:12  _____________________________________________________________________  External Attachment:    Type:   Image     Comment:   External Document

## 2011-01-05 ENCOUNTER — Encounter: Payer: Self-pay | Admitting: Family Medicine

## 2011-01-05 ENCOUNTER — Inpatient Hospital Stay (INDEPENDENT_AMBULATORY_CARE_PROVIDER_SITE_OTHER)
Admission: RE | Admit: 2011-01-05 | Discharge: 2011-01-05 | Disposition: A | Discharge status: Home / Self Care | Payer: Medicare Other | Source: Ambulatory Visit | Attending: Family Medicine | Admitting: Family Medicine

## 2011-01-05 DIAGNOSIS — I10 Essential (primary) hypertension: Secondary | ICD-10-CM

## 2011-01-05 DIAGNOSIS — L723 Sebaceous cyst: Secondary | ICD-10-CM

## 2011-01-26 ENCOUNTER — Emergency Department (HOSPITAL_COMMUNITY)
Admission: EM | Admit: 2011-01-26 | Discharge: 2011-01-26 | Discharge status: Against Medical Advice | Payer: Medicare Other | Attending: Emergency Medicine | Admitting: Emergency Medicine

## 2011-01-26 DIAGNOSIS — M25559 Pain in unspecified hip: Secondary | ICD-10-CM | POA: Insufficient documentation

## 2011-01-26 DIAGNOSIS — M79609 Pain in unspecified limb: Secondary | ICD-10-CM | POA: Insufficient documentation

## 2011-01-27 NOTE — Letter (Signed)
Summary: ent  ent   Imported By: Lind Guest 01/17/2011 09:23:46  _____________________________________________________________________  External Attachment:    Type:   Image     Comment:   External Document

## 2011-02-02 ENCOUNTER — Emergency Department (HOSPITAL_COMMUNITY)
Admission: EM | Admit: 2011-02-02 | Discharge: 2011-02-02 | Disposition: A | Discharge status: Home / Self Care | Payer: Medicare Other | Attending: Emergency Medicine | Admitting: Emergency Medicine

## 2011-02-02 DIAGNOSIS — G8929 Other chronic pain: Secondary | ICD-10-CM | POA: Insufficient documentation

## 2011-02-02 DIAGNOSIS — Z760 Encounter for issue of repeat prescription: Secondary | ICD-10-CM | POA: Insufficient documentation

## 2011-02-25 LAB — DIFFERENTIAL
Eosinophils Relative: 2 % (ref 0–5)
Lymphocytes Relative: 45 % (ref 12–46)
Lymphs Abs: 3.3 10*3/uL (ref 0.7–4.0)
Monocytes Relative: 7 % (ref 3–12)
Neutrophils Relative %: 46 % (ref 43–77)

## 2011-02-25 LAB — URINALYSIS, ROUTINE W REFLEX MICROSCOPIC
Glucose, UA: NEGATIVE mg/dL
Nitrite: NEGATIVE
Specific Gravity, Urine: 1.03 (ref 1.005–1.030)
pH: 5 (ref 5.0–8.0)

## 2011-02-25 LAB — COMPREHENSIVE METABOLIC PANEL
AST: 20 U/L (ref 0–37)
CO2: 30 mEq/L (ref 19–32)
Calcium: 9.7 mg/dL (ref 8.4–10.5)
Creatinine, Ser: 0.72 mg/dL (ref 0.4–1.2)
GFR calc Af Amer: >60 mL/min (ref >60)
GFR calc non Af Amer: >60 mL/min (ref >60)
Total Protein: 7.8 g/dL (ref 6.0–8.3)

## 2011-02-25 LAB — CBC
MCHC: 33.2 g/dL (ref 30.0–36.0)
MCV: 92.6 fL (ref 78.0–100.0)
Platelets: 197 10*3/uL (ref 150–400)
RBC: 4.62 MIL/uL (ref 3.87–5.11)
RDW: 14.9 % (ref 11.5–15.5)

## 2011-02-25 LAB — URINE MICROSCOPIC-ADD ON

## 2011-04-05 NOTE — Op Note (Signed)
Lori Singh, Lori Singh              ACCOUNT NO.:  [id number]   MEDICAL RECORD NO.:  [id number]          PATIENT TYPE:  AMB   LOCATION:  DAY                           FACILITY:  [facility]   PHYSICIAN:  Kassie Mends, M.D.      DATE OF BIRTH:  12-14-1955   DATE OF PROCEDURE:  DATE OF DISCHARGE:  03/30/2009                               OPERATIVE REPORT   PROCEDURE:  Ileocolonoscopy with snare cautery polypectomy.   INDICATION OF EXAM:  Ms. Beining is a 55 year old female who presents  with abdominal pain after eating.   FINDINGS:  1. An 8-mm slightly pedunculated hepatic flexure polyp removed via      snare cautery.  2. Pancolonic diverticulosis.  Otherwise, no masses, inflammatory      changes, or AVMs.  3. Small internal hemorrhoids.  Otherwise, normal retroflexed view of      the rectum.   DIAGNOSES:  1. Colon polyp.  2. Mild pancolonic diverticulosis.  3. Small internal hemorrhoids.   RECOMMENDATIONS:  1. No aspirin, NSAIDs, or anticoagulation for 7 days.  2. She should follow a high-fiber diet.  She was given a handout on      high-fiber diet, polyps, and hemorrhoids.  3. Will call her with the results of her polypectomy.  If she has a      simple adenoma, then consider screening colonoscopy in 5 years due      to the size of the polyp.  4. She was also given information on diverticulosis.  5. The patient still needs to submit stool sample for H. pylori stool      antigen.   MEDICATIONS:  1. Demerol 125 mg IV.  2. Versed 6 mg IV.   PROCEDURE TECHNIQUE:  Physical exam was performed.  Informed consent was  obtained from the patient after explaining the benefits, risks, and  alternatives to the procedure.  The patient was connected to the monitor  and placed in the left lateral position.  Continuous oxygen was provided  by nasal cannula and IV medicine administered through an indwelling  cannula.  After administration of sedation and rectal exam, the  patient's rectum was  entered, and the scope was advanced under direct  visualization to the distal terminal ileum.  The scope was removed  slowly  by carefully examining the color, texture, anatomy, and integrity of the  mucosa on the way out.  The patient was recovered in endoscopy and  discharged home in satisfactory condition.   PATH:  LARGE SIMPLE ADENOMA. TCS IN 5 YEARS. HIGH FIBER DIET.      Kassie Mends, M.D.  Electronically Signed     SM/MEDQ  D:  03/30/2009  T:  03/31/2009  Job:  [id number]   cc:   Milus Mallick. Lodema Hong, M.D.  Fax: [phone]

## 2011-04-08 NOTE — Op Note (Signed)
Lori Singh, Lori Singh              ACCOUNT NO.:  [id number]   MEDICAL RECORD NO.:  [id number]          PATIENT TYPE:  AMB   LOCATION:  DSC                          FACILITY:  [facility]   PHYSICIAN:  Loreta Ave, M.D. 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   POSTOPERATIVE DIAGNOSIS:  Impingement and distal clavicle osteolysis with  partial thickness tear rotator cuff, right shoulder, anterior labrum tear  and some mild inferior instability with grade 2 chondral changes posterior  humerus and anterior glenoid.   OPERATIVE PROCEDURE:  Right shoulder exam under anesthesia, arthroscopy with  debridement of labrum, rotator cuff, glenohumeral joint.  Acromioplasty with  coracoacromial ligament release.  Excision distal clavicle.   SURGEON:  Loreta Ave, M.D.   ASSISTANT:  Genene Churn. Barry Dienes, P.A.-C.   ANESTHESIA:  General.   ESTIMATED BLOOD LOSS:  Minimal.   SPECIMENS:  None.   CULTURES:  None.   COMPLICATIONS:  None.   DRESSINGS:  Soft compressive with a sling.   PROCEDURE:  The patient was brought to the operating room and placed on the  operating table in supine position.  After adequate anesthesia had been  obtained, the right shoulder was examined.  A little increased excursion  with anterior and inferior subluxation.  Not dislocatable but a little  loose.  Stable posteriorly.  Full motion.  Placed in a beach chair position  on the shoulder positioner, prepped and draped in the usual sterile fashion.  Three standard portals, anterior, posterior, and lateral.  The shoulder  entered with a blunt obturator, distended, and then inspected with the  arthroscope.  Some grade 2 changes with chondral loose bodies debrided.  Changes posterior humeral head and  anterior glenoid consistent with a little  anterior instability, but there was no Bankart lesion or significant  capsular stretch injury.  Loose bodies were removed.  Chondroplasty  performed.  Anterior superior labrum tear debrided.  Biceps tendon and  biceps anchor, remaining rotator cuff, capsular, and ligamentous structures  intact.  Cannulae redirected subacromial.  Bursa resected.  Cuff debrided.  Type 2-3 acromion.  Acromioplasty to a type 1 acromion with shaver and high  speed bur.  CA ligament released with cautery.  Distal clavicle, grade 4  changes, marked spurring.  Periarticular spurs and a lateral cm of clavicle  resection.  Adequacy of decompression confirmed  viewing from all portals.  Instruments removed.  The portals, shoulder, and  bursa injected with Marcaine.  The portals were closed with 4-0 nylon.  Sterile compressive dressing applied.  Anesthesia reversed.  Brought to the  recovery room.  Tolerated the surgery well without complications.      Loreta Ave, M.D.  Electronically Signed     DFM/MEDQ  D:  08/18/2005  T:  08/18/2005  Job:  [id number]

## 2011-08-31 ENCOUNTER — Other Ambulatory Visit (HOSPITAL_COMMUNITY): Payer: Self-pay | Admitting: Family Medicine

## 2011-08-31 DIAGNOSIS — Z139 Encounter for screening, unspecified: Secondary | ICD-10-CM

## 2011-09-09 ENCOUNTER — Ambulatory Visit (HOSPITAL_COMMUNITY): Payer: Medicare Other

## 2012-06-25 ENCOUNTER — Other Ambulatory Visit (HOSPITAL_COMMUNITY)
Admission: RE | Admit: 2012-06-25 | Discharge: 2012-06-25 | Disposition: A | Discharge status: Home / Self Care | Payer: Medicare Other | Source: Ambulatory Visit | Attending: Orthopaedic Surgery | Admitting: Orthopaedic Surgery

## 2012-06-25 DIAGNOSIS — M6530 Trigger finger, unspecified finger: Secondary | ICD-10-CM | POA: Insufficient documentation

## 2012-08-30 ENCOUNTER — Ambulatory Visit (HOSPITAL_COMMUNITY)
Admission: RE | Admit: 2012-08-30 | Discharge: 2012-08-30 | Disposition: A | Discharge status: Home / Self Care | Payer: Medicare Other | Source: Ambulatory Visit | Attending: Family Medicine | Admitting: Family Medicine

## 2012-08-30 DIAGNOSIS — I10 Essential (primary) hypertension: Secondary | ICD-10-CM | POA: Insufficient documentation

## 2012-08-30 DIAGNOSIS — I359 Nonrheumatic aortic valve disorder, unspecified: Secondary | ICD-10-CM | POA: Insufficient documentation

## 2012-08-30 DIAGNOSIS — R55 Syncope and collapse: Secondary | ICD-10-CM | POA: Insufficient documentation

## 2012-08-30 DIAGNOSIS — I369 Nonrheumatic tricuspid valve disorder, unspecified: Secondary | ICD-10-CM | POA: Insufficient documentation

## 2012-08-30 NOTE — Progress Notes (Signed)
  Echocardiogram 2D Echocardiogram has been performed.  Lori Singh 08/30/2012, 10:56 AM

## 2012-12-27 ENCOUNTER — Encounter (HOSPITAL_COMMUNITY): Payer: Self-pay | Admitting: Pharmacy Technician

## 2012-12-27 ENCOUNTER — Other Ambulatory Visit: Payer: Self-pay | Admitting: Radiology

## 2012-12-31 ENCOUNTER — Encounter (HOSPITAL_COMMUNITY): Admission: RE | Admit: 2012-12-31 | Payer: Medicare Other | Source: Ambulatory Visit

## 2013-01-03 ENCOUNTER — Encounter (HOSPITAL_COMMUNITY): Admission: RE | Payer: Self-pay | Source: Ambulatory Visit

## 2013-01-03 ENCOUNTER — Ambulatory Visit (HOSPITAL_COMMUNITY): Admission: RE | Admit: 2013-01-03 | Payer: Medicare Other | Source: Ambulatory Visit | Admitting: Orthopaedic Surgery

## 2013-01-03 SURGERY — RELEASE, A1 PULLEY, FOR TRIGGER FINGER
Anesthesia: Choice | Site: Thumb | Laterality: Left

## 2014-04-10 ENCOUNTER — Ambulatory Visit: Payer: Medicare Other | Admitting: Gastroenterology

## 2014-05-13 ENCOUNTER — Telehealth: Payer: Self-pay | Admitting: Gastroenterology

## 2014-05-13 ENCOUNTER — Ambulatory Visit: Payer: Medicare Other | Admitting: Gastroenterology

## 2014-05-13 NOTE — Telephone Encounter (Signed)
Please send letter.

## 2014-05-13 NOTE — Telephone Encounter (Signed)
Pt was a no show

## 2014-05-14 ENCOUNTER — Encounter: Payer: Self-pay | Admitting: Gastroenterology

## 2014-05-14 NOTE — Telephone Encounter (Signed)
Mailed letter °

## 2016-01-27 ENCOUNTER — Ambulatory Visit
Admission: RE | Admit: 2016-01-27 | Discharge: 2016-01-27 | Disposition: A | Discharge status: Home / Self Care | Payer: Medicare Other | Source: Ambulatory Visit | Attending: Family Medicine | Admitting: Family Medicine

## 2016-01-27 ENCOUNTER — Other Ambulatory Visit: Payer: Self-pay | Admitting: Family Medicine

## 2016-01-27 DIAGNOSIS — M25552 Pain in left hip: Principal | ICD-10-CM

## 2016-01-27 DIAGNOSIS — G8929 Other chronic pain: Secondary | ICD-10-CM

## 2016-06-19 ENCOUNTER — Emergency Department (HOSPITAL_COMMUNITY): Payer: Medicare Other

## 2016-06-19 ENCOUNTER — Encounter (HOSPITAL_COMMUNITY): Payer: Self-pay | Admitting: Emergency Medicine

## 2016-06-19 ENCOUNTER — Observation Stay (HOSPITAL_COMMUNITY)
Admission: EM | Admit: 2016-06-19 | Discharge: 2016-06-20 | Disposition: A | Discharge status: Home / Self Care | Payer: Medicare Other | Attending: Internal Medicine | Admitting: Internal Medicine

## 2016-06-19 DIAGNOSIS — I10 Essential (primary) hypertension: Secondary | ICD-10-CM | POA: Insufficient documentation

## 2016-06-19 DIAGNOSIS — Z79899 Other long term (current) drug therapy: Secondary | ICD-10-CM | POA: Insufficient documentation

## 2016-06-19 DIAGNOSIS — Y929 Unspecified place or not applicable: Secondary | ICD-10-CM | POA: Insufficient documentation

## 2016-06-19 DIAGNOSIS — Y939 Activity, unspecified: Secondary | ICD-10-CM | POA: Insufficient documentation

## 2016-06-19 DIAGNOSIS — F17210 Nicotine dependence, cigarettes, uncomplicated: Secondary | ICD-10-CM | POA: Insufficient documentation

## 2016-06-19 DIAGNOSIS — R0789 Other chest pain: Principal | ICD-10-CM | POA: Insufficient documentation

## 2016-06-19 DIAGNOSIS — Y999 Unspecified external cause status: Secondary | ICD-10-CM | POA: Insufficient documentation

## 2016-06-19 DIAGNOSIS — F1410 Cocaine abuse, uncomplicated: Secondary | ICD-10-CM

## 2016-06-19 DIAGNOSIS — R079 Chest pain, unspecified: Secondary | ICD-10-CM | POA: Diagnosis not present

## 2016-06-19 DIAGNOSIS — R45851 Suicidal ideations: Secondary | ICD-10-CM

## 2016-06-19 HISTORY — DX: Essential (primary) hypertension: I10

## 2016-06-19 HISTORY — DX: Unspecified osteoarthritis, unspecified site: M19.90

## 2016-06-19 HISTORY — DX: Hyperlipidemia, unspecified: E78.5

## 2016-06-19 HISTORY — DX: Scoliosis, unspecified: M41.9

## 2016-06-19 LAB — BASIC METABOLIC PANEL
Anion gap: 8 (ref 5–15)
BUN: 14 mg/dL (ref 6–20)
CALCIUM: 9.2 mg/dL (ref 8.9–10.3)
CHLORIDE: 107 mmol/L (ref 101–111)
CO2: 26 mmol/L (ref 22–32)
CREATININE: 0.65 mg/dL (ref 0.44–1.00)
GFR calc Af Amer: >60 mL/min (ref >60)
GFR calc non Af Amer: >60 mL/min (ref >60)
GLUCOSE: 92 mg/dL (ref 65–99)
Potassium: 3.5 mmol/L (ref 3.5–5.1)
Sodium: 141 mmol/L (ref 135–145)

## 2016-06-19 LAB — CBC WITH DIFFERENTIAL/PLATELET
BASOS PCT: 0 %
Basophils Absolute: 0 10*3/uL (ref 0.0–0.1)
Eosinophils Absolute: 0.1 10*3/uL (ref 0.0–0.7)
Eosinophils Relative: 1 %
HEMATOCRIT: 43.1 % (ref 36.0–46.0)
HEMOGLOBIN: 13.8 g/dL (ref 12.0–15.0)
LYMPHS ABS: 3.6 10*3/uL (ref 0.7–4.0)
LYMPHS PCT: 37 %
MCH: 29.7 pg (ref 26.0–34.0)
MCHC: 32 g/dL (ref 30.0–36.0)
MCV: 92.9 fL (ref 78.0–100.0)
MONO ABS: 0.5 10*3/uL (ref 0.1–1.0)
MONOS PCT: 6 %
NEUTROS ABS: 5.6 10*3/uL (ref 1.7–7.7)
NEUTROS PCT: 56 %
Platelets: 355 10*3/uL (ref 150–400)
RBC: 4.64 MIL/uL (ref 3.87–5.11)
RDW: 14.6 % (ref 11.5–15.5)
WBC: 9.9 10*3/uL (ref 4.0–10.5)

## 2016-06-19 LAB — TROPONIN I: Troponin I: 0.05 ng/mL (ref <0.03)

## 2016-06-19 MED ORDER — ONDANSETRON HCL 4 MG/2ML IJ SOLN: 4.0000 mg | Freq: Four times a day (QID) | INTRAMUSCULAR | Status: DC | PRN

## 2016-06-19 MED ORDER — CYCLOSPORINE 0.05 % OP EMUL
OPHTHALMIC | Status: AC
  Filled 2016-06-19: qty 1

## 2016-06-19 MED ORDER — ACETAMINOPHEN 325 MG PO TABS: 650.0000 mg | ORAL_TABLET | ORAL | Status: DC | PRN

## 2016-06-19 MED ORDER — NITROGLYCERIN 0.4 MG SL SUBL: 0.4000 mg | SUBLINGUAL_TABLET | SUBLINGUAL | Status: DC | PRN

## 2016-06-19 MED ORDER — MORPHINE SULFATE (PF) 2 MG/ML IV SOLN
2.0000 mg | INTRAVENOUS | Status: DC | PRN
  Administered 2016-06-19 – 2016-06-20 (×2): 2 mg via INTRAVENOUS
  Filled 2016-06-19 (×2): qty 1

## 2016-06-19 MED ORDER — MORPHINE SULFATE (PF) 4 MG/ML IV SOLN
4.0000 mg | INTRAVENOUS | Status: DC | PRN
  Administered 2016-06-19: 4 mg via INTRAVENOUS
  Filled 2016-06-19: qty 1

## 2016-06-19 MED ORDER — CYCLOSPORINE 0.05 % OP EMUL
1.0000 [drp] | Freq: Every day | OPHTHALMIC | Status: DC
Start: 2016-06-19 — End: 2016-06-20
  Administered 2016-06-20 (×2): 1 [drp] via OPHTHALMIC
  Filled 2016-06-19 (×4): qty 1

## 2016-06-19 MED ORDER — ALPRAZOLAM 0.25 MG PO TABS
0.2500 mg | ORAL_TABLET | Freq: Once | ORAL | Status: AC
  Administered 2016-06-19: 0.25 mg via ORAL
  Filled 2016-06-19: qty 1

## 2016-06-19 MED ORDER — AMLODIPINE BESYLATE 5 MG PO TABS
5.0000 mg | ORAL_TABLET | Freq: Every day | ORAL | Status: DC
  Administered 2016-06-19 – 2016-06-20 (×2): 5 mg via ORAL
  Filled 2016-06-19 (×2): qty 1

## 2016-06-19 MED ORDER — ENOXAPARIN SODIUM 40 MG/0.4ML ~~LOC~~ SOLN
40.0000 mg | SUBCUTANEOUS | Status: DC
  Administered 2016-06-19: 40 mg via SUBCUTANEOUS
  Filled 2016-06-19: qty 0.4

## 2016-06-19 MED ORDER — HYDRALAZINE HCL 25 MG PO TABS: 25.0000 mg | ORAL_TABLET | Freq: Four times a day (QID) | ORAL | Status: DC | PRN
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
## 2016-06-20 ENCOUNTER — Observation Stay (HOSPITAL_COMMUNITY): Payer: Medicare Other

## 2016-06-20 DIAGNOSIS — I10 Essential (primary) hypertension: Secondary | ICD-10-CM | POA: Diagnosis not present

## 2016-06-20 DIAGNOSIS — F1410 Cocaine abuse, uncomplicated: Secondary | ICD-10-CM | POA: Diagnosis not present

## 2016-06-20 DIAGNOSIS — R45851 Suicidal ideations: Secondary | ICD-10-CM | POA: Diagnosis not present

## 2016-06-20 DIAGNOSIS — R079 Chest pain, unspecified: Secondary | ICD-10-CM | POA: Diagnosis not present

## 2016-06-20 DIAGNOSIS — R0789 Other chest pain: Secondary | ICD-10-CM | POA: Diagnosis not present

## 2016-06-20 LAB — TROPONIN I
TROPONIN I: 0.03 ng/mL — AB (ref <0.03)
Troponin I: 0.05 ng/mL (ref <0.03)

## 2016-06-20 LAB — RAPID URINE DRUG SCREEN, HOSP PERFORMED
AMPHETAMINES: NOT DETECTED
Barbiturates: NOT DETECTED
Benzodiazepines: NOT DETECTED
Cocaine: POSITIVE — AB
OPIATES: POSITIVE — AB
Tetrahydrocannabinol: POSITIVE — AB

## 2016-06-20 NOTE — Progress Notes (Signed)
Security notified to lock-up patient belongings. Pt placed on suicide precautions. Sitter at bedside. Pt placed in paper scrubs and wanded. Trash cans with liners placed outside of room, room searched, and telephone removed from room. Pt remains on telemetry per MD order. All suicide precautions followed.

## 2016-06-20 NOTE — Plan of Care (Signed)
Problem: Phase I Progression Outcomes Goal: Hemodynamically stable Outcome: Progressing Pt sinus rhythm on monitor.
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
## 2016-06-20 NOTE — Care Management Note (Addendum)
Case Management Note  Patient Details  Name: MAILANI STORMES MRN: [id number] Date of Birth: [birth year]  Subjective/Objective: Patient from jail with CP, cleared medically. Needs Pysch eval. Spoke with nurse at [facility] jail, she states [facility] can see patient when she returns to jail.                    Action/Plan: DC in custody, to return to jail. No CM needs.   Expected Discharge Date:  06/21/16               Expected Discharge Plan:  Corrections Facility  In-House Referral:  Clinical Social Work  Discharge planning Services  CM Consult  Post Acute Care Choice:    Choice offered to:     DME Arranged:    DME Agency:     HH Arranged:    HH Agency:     Status of Service:  Completed, signed off  If discussed at [employer], dates discussed:    Additional Comments:  Elizer Bostic, Chauncey Reading, RN 06/20/2016, 2:00 PM

## 2018-09-04 DIAGNOSIS — M419 Scoliosis, unspecified: Secondary | ICD-10-CM | POA: Insufficient documentation

## 2018-09-04 DIAGNOSIS — M961 Postlaminectomy syndrome, not elsewhere classified: Secondary | ICD-10-CM | POA: Insufficient documentation

## 2019-01-15 DIAGNOSIS — M461 Sacroiliitis, not elsewhere classified: Secondary | ICD-10-CM | POA: Insufficient documentation

## 2019-08-29 ENCOUNTER — Ambulatory Visit: Payer: Medicare HMO

## 2019-08-29 ENCOUNTER — Other Ambulatory Visit: Payer: Self-pay

## 2019-08-29 ENCOUNTER — Ambulatory Visit (INDEPENDENT_AMBULATORY_CARE_PROVIDER_SITE_OTHER): Payer: Medicare HMO | Admitting: Orthopaedic Surgery

## 2019-08-29 ENCOUNTER — Encounter: Payer: Self-pay | Admitting: Orthopaedic Surgery

## 2019-08-29 VITALS — BP 145/54 | HR 54 | Ht 62.5 in | Wt 114.0 lb

## 2019-08-29 DIAGNOSIS — M25562 Pain in left knee: Secondary | ICD-10-CM | POA: Diagnosis not present

## 2019-08-29 DIAGNOSIS — G8929 Other chronic pain: Secondary | ICD-10-CM

## 2019-08-29 NOTE — Progress Notes (Signed)
dgd

## 2019-08-29 NOTE — Progress Notes (Signed)
Subjective:    Patient ID: Lori Singh, female    DOB: Feb 10, 1956, 63 y.o.   MRN: [id number]  HPI She has had pain of the left knee over the last two weeks, more over the last few days.  She has had one episode of giving way of the left knee.  She has had no trauma, no redness, no swelling.  She is concerned about the pain.  She had a problem several years ago that went away.  She has not taken anything for it. She has used ice.   Review of Systems  Constitutional: Positive for activity change.  Musculoskeletal: Positive for arthralgias and gait problem.  All other systems reviewed and are negative.  For Review of Systems, all other systems reviewed and are negative.  The following is a summary of the past history medically, past history surgically, known current medicines, social history and family history.  This information is gathered electronically by the computer from prior information and documentation.  I review this each visit and have found including this information at this point in the chart is beneficial and informative.   Past Medical History:  Diagnosis Date  . Arthritis   . Hyperlipidemia   . Hypertension   . Scoliosis     Past Surgical History:  Procedure Laterality Date  . BACK SURGERY    . COLONOSCOPY  03/30/2009   [id number] polyp/Mild pancolonic diverticulosis/small internal hemorrhoid    Current Outpatient Medications on File Prior to Visit  Medication Sig Dispense Refill  . amLODipine (NORVASC) 5 MG tablet Take 5 mg by mouth daily.    . benazepril (LOTENSIN) 40 MG tablet Take by mouth.    . hydrochlorothiazide (MICROZIDE) 12.5 MG capsule TK 1 C PO QAM FOR BLOOD PRESSURE     No current facility-administered medications on file prior to visit.     Social History   Socioeconomic History  . Marital status: Divorced    Spouse name: Not on file  . Number of children: Not on file  . Years of education: Not on file  . Highest education level: Not  on file  Occupational History  . Not on file  Social Needs  . Financial resource strain: Not on file  . Food insecurity    Worry: Not on file    Inability: Not on file  . Transportation needs    Medical: Not on file    Non-medical: Not on file  Tobacco Use  . Smoking status: Current Every Day Smoker    Packs/day: 1.00    Types: Cigarettes  . Smokeless tobacco: Never Used  Substance and Sexual Activity  . Alcohol use: No  . Drug use: No  . Sexual activity: Not on file  Lifestyle  . Physical activity    Days per week: Not on file    Minutes per session: Not on file  . Stress: Not on file  Relationships  . Social Herbalist on phone: Not on file    Gets together: Not on file    Attends religious service: Not on file    Active member of club or organization: Not on file    Attends meetings of clubs or organizations: Not on file    Relationship status: Not on file  . Intimate partner violence    Fear of current or ex partner: Not on file    Emotionally abused: Not on file    Physically abused: Not on file    Forced  sexual activity: Not on file  Other Topics Concern  . Not on file  Social History Narrative  . Not on file    History reviewed. No pertinent family history.  BP (!) 145/54   Pulse (!) 54   Ht 5' 2.5" (1.588 m)   Wt 114 lb (51.7 kg)   BMI 20.52 kg/m   Body mass index is 20.52 kg/m.      Objective:   Physical Exam Vitals signs reviewed.  Constitutional:      Appearance: She is well-developed.  HENT:     Head: Normocephalic and atraumatic.  Eyes:     Conjunctiva/sclera: Conjunctivae normal.     Pupils: Pupils are equal, round, and reactive to light.  Neck:     Musculoskeletal: Normal range of motion and neck supple.  Cardiovascular:     Rate and Rhythm: Normal rate and regular rhythm.  Pulmonary:     Effort: Pulmonary effort is normal.  Abdominal:     Palpations: Abdomen is soft.  Musculoskeletal:     Left knee: She exhibits  decreased range of motion. Tenderness found. Medial joint line tenderness noted.       Legs:  Skin:    General: Skin is warm and dry.  Neurological:     Mental Status: She is alert and oriented to person, place, and time.     Cranial Nerves: No cranial nerve deficit.     Motor: No abnormal muscle tone.     Coordination: Coordination normal.     Deep Tendon Reflexes: Reflexes are normal and symmetric. Reflexes normal.  Psychiatric:        Behavior: Behavior normal.        Thought Content: Thought content normal.        Judgment: Judgment normal.      X-rays were done of the left knee, three views.  Reported separately.     Assessment & Plan:   Encounter Diagnosis  Name Primary?  . Chronic pain of left knee Yes   PROCEDURE NOTE:  The patient requests injections of the left knee , verbal consent was obtained.  The left knee was prepped appropriately after time out was performed.   Sterile technique was observed and injection of 1 cc of Depo-Medrol 40 mg with several cc's of plain xylocaine. Anesthesia was provided by ethyl chloride and a 20-gauge needle was used to inject the knee area. The injection was tolerated well.  A band aid dressing was applied.  The patient was advised to apply ice later today and tomorrow to the injection sight as needed.  I will see her in two weeks.  Call if any problem.  Precautions discussed.   Electronically Signed Sanjuana Kava, MD 10/8/202012:30 PM

## 2019-09-12 ENCOUNTER — Encounter: Payer: Self-pay | Admitting: Orthopaedic Surgery

## 2019-09-12 ENCOUNTER — Ambulatory Visit (INDEPENDENT_AMBULATORY_CARE_PROVIDER_SITE_OTHER): Payer: Medicare HMO | Admitting: Orthopaedic Surgery

## 2019-09-12 ENCOUNTER — Other Ambulatory Visit: Payer: Self-pay

## 2019-09-12 VITALS — Ht 62.5 in | Wt 114.0 lb

## 2019-09-12 DIAGNOSIS — M25562 Pain in left knee: Secondary | ICD-10-CM | POA: Diagnosis not present

## 2019-09-12 DIAGNOSIS — G8929 Other chronic pain: Secondary | ICD-10-CM

## 2019-09-12 MED ORDER — HYDROCODONE-ACETAMINOPHEN 5-325 MG PO TABS: ORAL_TABLET | ORAL | 0 refills | Status: DC

## 2019-09-12 NOTE — Progress Notes (Signed)
Patient [id number] Lori Singh, female DOB:[birth year], 63 y.o. [id number]  Chief Complaint  Patient presents with  . Knee Pain    Lt knee     HPI  Lori Singh is a 63 y.o. female who has continued pain of the left knee and giving way.  The injection did not help much.  She has medial joint line pain. I am concerned about meniscus tear. I will get MRI.   Body mass index is 20.52 kg/m.  ROS  Review of Systems  Constitutional: Positive for activity change.  Musculoskeletal: Positive for arthralgias and gait problem.  All other systems reviewed and are negative.   All other systems reviewed and are negative.  The following is a summary of the past history medically, past history surgically, known current medicines, social history and family history.  This information is gathered electronically by the computer from prior information and documentation.  I review this each visit and have found including this information at this point in the chart is beneficial and informative.    Past Medical History:  Diagnosis Date  . Arthritis   . Hyperlipidemia   . Hypertension   . Scoliosis     Past Surgical History:  Procedure Laterality Date  . BACK SURGERY    . COLONOSCOPY  03/30/2009   [id number] polyp/Mild pancolonic diverticulosis/small internal hemorrhoid    History reviewed. No pertinent family history.  Social History Social History   Tobacco Use  . Smoking status: Former Smoker    Packs/day: 1.00    Types: Cigarettes  . Smokeless tobacco: Never Used  Substance Use Topics  . Alcohol use: No  . Drug use: No    Allergies  Allergen Reactions  . Aspirin     REACTION: STOMACH BLEEDS  . Atorvastatin Other (See Comments)    REACTION: legs aching REACTION: legs aching  . Sulfonamide Derivatives     Current Outpatient Medications  Medication Sig Dispense Refill  . amLODipine (NORVASC) 5 MG tablet Take 5 mg by mouth daily.    . benazepril (LOTENSIN) 40 MG  tablet Take by mouth.    . hydrochlorothiazide (MICROZIDE) 12.5 MG capsule TK 1 C PO QAM FOR BLOOD PRESSURE    . HYDROcodone-acetaminophen (NORCO/VICODIN) 5-325 MG tablet One tablet every four hours as needed for acute pain.  Limit of five days per [place] statue. 30 tablet 0   No current facility-administered medications for this visit.      Physical Exam  Height 5' 2.5" (1.588 m), weight 114 lb (51.7 kg).  Constitutional: overall normal hygiene, normal nutrition, well developed, normal grooming, normal body habitus. Assistive device:none  Musculoskeletal: gait and station Limp left, muscle tone and strength are normal, no tremors or atrophy is present.  .  Neurological: coordination overall normal.  Deep tendon reflex/nerve stretch intact.  Sensation normal.  Cranial nerves II-XII intact.   Skin:   Normal overall no scars, lesions, ulcers or rashes. No psoriasis.  Psychiatric: Alert and oriented x 3.  Recent memory intact, remote memory unclear.  Normal mood and affect. Well groomed.  Good eye contact.  Cardiovascular: overall no swelling, no varicosities, no edema bilaterally, normal temperatures of the legs and arms, no clubbing, cyanosis and good capillary refill.  Lymphatic: palpation is normal.  Left knee with pain medially, ROM 0 to 110, NV intact, positive medial Mc[place].  Limp left.  All other systems reviewed and are negative   The patient has been educated about the nature of the problem(s) and counseled  on treatment options.  The patient appeared to understand what I have discussed and is in agreement with it.  Encounter Diagnosis  Name Primary?  . Chronic pain of left knee Yes    PLAN Call if any problems.  Precautions discussed.  Continue current medications.   Return to clinic after MRi of the left knee  I have reviewed the [facility] web site prior to prescribing narcotic medicine for this  patient.    Electronically Signed Sanjuana Kava, MD 10/22/202010:07 AM

## 2019-09-18 ENCOUNTER — Telehealth: Payer: Self-pay | Admitting: Orthopaedic Surgery

## 2019-09-18 NOTE — Telephone Encounter (Signed)
In-box/Staff message entered into referral workqueue - to attention Wendy May.

## 2019-09-20 ENCOUNTER — Other Ambulatory Visit: Payer: Self-pay

## 2019-09-20 ENCOUNTER — Ambulatory Visit (HOSPITAL_COMMUNITY)
Admission: RE | Admit: 2019-09-20 | Discharge: 2019-09-20 | Disposition: A | Discharge status: Home / Self Care | Payer: Medicare HMO | Source: Ambulatory Visit | Attending: Orthopaedic Surgery | Admitting: Orthopaedic Surgery

## 2019-09-20 DIAGNOSIS — G8929 Other chronic pain: Secondary | ICD-10-CM | POA: Diagnosis present

## 2019-09-20 DIAGNOSIS — M25562 Pain in left knee: Secondary | ICD-10-CM | POA: Diagnosis not present

## 2019-10-15 ENCOUNTER — Telehealth: Payer: Self-pay | Admitting: Orthopedic Surgery

## 2019-10-15 MED ORDER — HYDROCODONE-ACETAMINOPHEN 5-325 MG PO TABS: ORAL_TABLET | ORAL | 0 refills | Status: DC

## 2019-10-15 NOTE — Telephone Encounter (Signed)
Called patient; aware. Scheduled appointment accordingly.

## 2019-10-15 NOTE — Telephone Encounter (Signed)
(  1)Patient requests refill for medication:  HYDROcodone-acetaminophen (NORCO/VICODIN) 5-325 MG tablet 30 tablet  -[place]  -(2) in addition, patient relays had her MRI done at [facility], on 09/20/19. Scheduled for next available appointment.  May patient have a virtual visit for results?

## 2019-10-23 ENCOUNTER — Other Ambulatory Visit: Payer: Self-pay

## 2019-10-23 ENCOUNTER — Encounter: Payer: Self-pay | Admitting: Orthopaedic Surgery

## 2019-10-23 ENCOUNTER — Ambulatory Visit (INDEPENDENT_AMBULATORY_CARE_PROVIDER_SITE_OTHER): Payer: Medicare HMO | Admitting: Orthopaedic Surgery

## 2019-10-23 DIAGNOSIS — M25562 Pain in left knee: Secondary | ICD-10-CM

## 2019-10-23 DIAGNOSIS — G8929 Other chronic pain: Secondary | ICD-10-CM

## 2019-10-23 NOTE — Progress Notes (Signed)
Virtual Visit via Telephone Note  I connected with@ on 10/23/19 at  9:10 AM EST by telephone and verified that I am speaking with the correct person using two identifiers.  Location: Patient: home  Provider: home     I discussed the limitations, risks, security and privacy concerns of performing an evaluation and management service by telephone and the availability of in person appointments. I also discussed with the patient that there may be a patient responsible charge related to this service. The patient expressed understanding and agreed to proceed.   History of Present Illness: She has had increasing pain of her left knee.  She had a MRI done on 09-20-2019.  The findings were: IMPRESSION: 1. 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 Slight progression of thinning of the articular cartilage in the medial compartment. 2. Small joint effusion with tiny Baker's cyst as seen on the prior exam.  I have explained the findings to her.  She has stomach problems and cannot take NSAIDs.  I have told her to take Tylenol and also to try rubs such as Aspercreme or BioFreeze. She does not need surgery at this point.   Observations/Objective: Per above.  Assessment and Plan: Encounter Diagnosis  Name Primary?  . Chronic pain of left knee Yes     Follow Up Instructions: Six weeks.   I discussed the assessment and treatment plan with the patient. The patient was provided an opportunity to ask questions and all were answered. The patient agreed with the plan and demonstrated an understanding of the instructions.   The patient was advised to call back or seek an in-person evaluation if the symptoms worsen or if the condition fails to improve as anticipated.  I provided 8 minutes of non-face-to-face time during this encounter.   Sanjuana Kava, MD

## 2019-10-24 ENCOUNTER — Ambulatory Visit: Payer: Medicare HMO | Admitting: Orthopaedic Surgery

## 2019-11-28 ENCOUNTER — Ambulatory Visit: Payer: Medicare HMO | Admitting: Orthopaedic Surgery

## 2019-12-04 ENCOUNTER — Ambulatory Visit (INDEPENDENT_AMBULATORY_CARE_PROVIDER_SITE_OTHER): Payer: Medicare HMO | Admitting: Orthopaedic Surgery

## 2019-12-04 ENCOUNTER — Encounter: Payer: Self-pay | Admitting: Orthopaedic Surgery

## 2019-12-04 ENCOUNTER — Other Ambulatory Visit: Payer: Self-pay

## 2019-12-04 DIAGNOSIS — G8929 Other chronic pain: Secondary | ICD-10-CM | POA: Diagnosis not present

## 2019-12-04 DIAGNOSIS — M25562 Pain in left knee: Secondary | ICD-10-CM | POA: Diagnosis not present

## 2019-12-04 NOTE — Progress Notes (Signed)
Virtual Visit via Telephone Note  I connected with@ on 12/04/19 at  9:00 AM EST by telephone and verified that I am speaking with the correct person using two identifiers.  Location: Patient: home Provider: home   I discussed the limitations, risks, security and privacy concerns of performing an evaluation and management service by telephone and the availability of in person appointments. I also discussed with the patient that there may be a patient responsible charge related to this service. The patient expressed understanding and agreed to proceed.   History of Present Illness: She has pain in the left knee still.  It has good days and bad days.  She has had more good days recently.  She has no recent trauma.She has swelling and popping at times.  She has no redness.   Observations/Objective: Per above.  Assessment and Plan: Encounter Diagnosis  Name Primary?  . Chronic pain of left knee Yes     Follow Up Instructions: I will see as needed.  She is stable.  Call if it gets worse.   I discussed the assessment and treatment plan with the patient. The patient was provided an opportunity to ask questions and all were answered. The patient agreed with the plan and demonstrated an understanding of the instructions.   The patient was advised to call back or seek an in-person evaluation if the symptoms worsen or if the condition fails to improve as anticipated.  I provided 8 minutes of non-face-to-face time during this encounter.   Sanjuana Kava, MD

## 2019-12-13 ENCOUNTER — Other Ambulatory Visit: Payer: Self-pay | Admitting: Physician Assistant

## 2019-12-13 ENCOUNTER — Telehealth: Payer: Self-pay | Admitting: Nurse Practitioner

## 2019-12-13 DIAGNOSIS — G8929 Other chronic pain: Secondary | ICD-10-CM

## 2019-12-13 NOTE — Telephone Encounter (Signed)
Phone call to patient to verify medication list and allergies for myelogram procedure. Pt aware she will not need to hold any medications for this procedure. Pre and post procedure instructions reviewed with pt. Pt verbalized understanding. 

## 2019-12-24 ENCOUNTER — Ambulatory Visit
Admission: RE | Admit: 2019-12-24 | Discharge: 2019-12-24 | Disposition: A | Discharge status: Home / Self Care | Payer: Medicare HMO | Source: Ambulatory Visit | Attending: Physician Assistant | Admitting: Physician Assistant

## 2019-12-24 DIAGNOSIS — M549 Dorsalgia, unspecified: Secondary | ICD-10-CM

## 2019-12-24 DIAGNOSIS — G8929 Other chronic pain: Secondary | ICD-10-CM

## 2019-12-24 MED ORDER — IOPAMIDOL (ISOVUE-M 300) INJECTION 61%
10.0000 mL | Freq: Once | INTRAMUSCULAR | Status: AC
  Administered 2019-12-24: 10 mL via INTRATHECAL

## 2019-12-24 MED ORDER — MEPERIDINE HCL 100 MG/ML IJ SOLN
50.0000 mg | Freq: Once | INTRAMUSCULAR | Status: AC
  Administered 2019-12-24: 50 mg via INTRAMUSCULAR

## 2019-12-24 MED ORDER — DIAZEPAM 5 MG PO TABS
5.0000 mg | ORAL_TABLET | Freq: Once | ORAL | Status: AC
  Administered 2019-12-24: 5 mg via ORAL

## 2019-12-24 MED ORDER — ONDANSETRON HCL 4 MG/2ML IJ SOLN
4.0000 mg | Freq: Once | INTRAMUSCULAR | Status: AC
  Administered 2019-12-24: 4 mg via INTRAMUSCULAR

## 2019-12-24 NOTE — Discharge Instructions (Signed)

## 2020-02-24 ENCOUNTER — Other Ambulatory Visit: Payer: Self-pay

## 2020-02-24 ENCOUNTER — Other Ambulatory Visit: Payer: Self-pay | Admitting: Neurosurgery

## 2020-03-17 ENCOUNTER — Encounter: Payer: Medicare HMO | Admitting: Vascular Surgery

## 2020-03-31 ENCOUNTER — Other Ambulatory Visit (HOSPITAL_COMMUNITY): Payer: Medicare HMO

## 2020-04-03 ENCOUNTER — Inpatient Hospital Stay: Admit: 2020-04-03 | Payer: Medicare HMO | Admitting: Neurosurgery

## 2020-04-03 SURGERY — ANTERIOR LUMBAR FUSION 2 LEVELS
Anesthesia: General | Laterality: Right

## 2020-04-06 ENCOUNTER — Inpatient Hospital Stay: Admit: 2020-04-06 | Payer: Medicare HMO | Admitting: Neurosurgery

## 2020-04-06 SURGERY — POSTERIOR LUMBAR FUSION 4 LEVEL
Anesthesia: General | Site: Back

## 2020-04-13 ENCOUNTER — Encounter: Payer: Self-pay | Admitting: Surgery

## 2020-05-14 ENCOUNTER — Other Ambulatory Visit (HOSPITAL_COMMUNITY): Payer: Self-pay | Admitting: Sports Medicine

## 2020-05-14 DIAGNOSIS — Z1231 Encounter for screening mammogram for malignant neoplasm of breast: Secondary | ICD-10-CM

## 2020-11-11 NOTE — Progress Notes (Signed)
Office Visit Note  Patient: Lori Singh             Date of Birth: 08-22-1956           MRN: [id number]             PCP: Leonie Douglas, MD Referring: Wyatt Haste, NP Visit Date: 11/12/2020  Subjective:  Pain and Edema of the Right Hand, Pain and Edema of the Left Hand, Pain of the Spine, Pain of the Right Foot, Pain of the Left Foot, and New Patient (Initial Visit)   History of Present Illness: Lori Singh is a 64 y.o. female here for evaluation of positive ANA, elevated ESR, pain in bilateral hands and in back. 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 Her more recent problem is increased pain in her hands and feet and new nodules on her fingers. She tried taking a course of prednisone for her joint pain this gave no benefit that she could notice. She did experience some weight gain with improved appetite, as she had been suffering significant unintentional weight loss. In addition the hand feet pain she also notices numbness that varies but has started dropping items unintentionally despite otherwise normal gripping strength.  Labs reviewed 09/2020 ANA positive ESR 45 CRP 12.7 Vitamin D 13 RF neg CCP neg CMP wnl TSH wnl  Imaging reviewed 08/2019 Xray left knee and MRI left knee Chondrocalcinosis, mild chondromalacia, small effusion   Activities of Daily Living:  Patient reports morning stiffness for 24 hours.   Patient Reports nocturnal pain.  Difficulty dressing/grooming: Reports Difficulty climbing stairs: Reports Difficulty getting out of chair: Reports Difficulty using hands for taps, buttons, cutlery, and/or writing: Reports  Review of Systems  Constitutional: Positive for fatigue.  HENT: Positive for mouth sores, mouth dryness and nose dryness.   Eyes: Positive for pain, itching, visual disturbance and dryness.  Respiratory: Positive for shortness of breath  and difficulty breathing. Negative for cough and hemoptysis.   Cardiovascular: Positive for chest pain, palpitations and swelling in legs/feet.  Gastrointestinal: Positive for abdominal pain, constipation and diarrhea. Negative for blood in stool.  Endocrine: Positive for increased urination.  Genitourinary: Negative for painful urination.  Musculoskeletal: Positive for arthralgias, joint pain, joint swelling, myalgias, muscle weakness, morning stiffness, muscle tenderness and myalgias.  Skin: Positive for color change, rash and redness.  Allergic/Immunologic: Positive for susceptible to infections.  Neurological: Positive for dizziness, numbness, memory loss and weakness. Negative for headaches.  Hematological: Positive for swollen glands.  Psychiatric/Behavioral: Positive for confusion and sleep disturbance.    PMFS History:  Patient Active Problem List   Diagnosis Date Noted  . Bilateral hand pain 11/12/2020  . Chondrocalcinosis 11/12/2020  . Positive ANA (antinuclear antibody) 11/12/2020  . Vitamin D deficiency 11/12/2020  . Sacroiliitis ([place]) 01/15/2019  . 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 Lumbar scoliosis 09/04/2018  . Suicidal ideation 06/20/2016  . Cocaine abuse ([place]) 06/20/2016  . Chest pain 06/19/2016  . OTITIS MEDIA, LEFT 11/10/2010  . NECK PAIN, CHRONIC 02/15/2010  . OSTEOPOROSIS 11/30/2009  . BLOOD CHEMISTRY, ABNORMAL 05/07/2009  . COLONIC POLYPS, ADENOMATOUS 05/01/2009  . DIVERTICULOSIS OF COLON 05/01/2009  . HELICOBACTER PYLORI INFECTION 03/20/2009  . CONSTIPATION 03/20/2009  . FATIGUE 12/10/2008  . HYPERLIPIDEMIA 01/31/2008  . Essential hypertension, benign 01/31/2008  . ALLERGIC RHINITIS 01/31/2008  . DEPRESSION, CHRONIC 12/11/2007  . BACK PAIN, CHRONIC 12/11/2007  Past Medical History:  Diagnosis Date  . Allergies    inhalants  . Arm pain   . Arm weakness   . Arthritis   . Back pain    Chronic, Bilateral  . Balance disorder   . DDD  (degenerative disc disease), lumbar   . Foot swelling   . Gastritis   . Hand swelling   . Heart murmur   . Hyperlipidemia   . Hypertension   . Joint pain   . Joint swelling   . Leg pain   . Leg weakness   . Loss of coordination    Arms  . Neck pain   . Radiculopathy, lumbar region   . Ringing in ears   . Sagittal plane imbalance   . Scoliosis of thoracolumbar spine    Idiopathic    Family History  Problem Relation Age of Onset  . Kidney failure Mother   . Stroke Father   . Stroke Sister   . COPD Brother   . Heart disease Brother   . COPD Sister   . Alcohol abuse Brother   . Alcohol abuse Brother   . Scoliosis Brother   . Schizophrenia Brother   . Heart attack Sister   . Heart disease Sister    Past Surgical History:  Procedure Laterality Date  . BACK SURGERY    . COLONOSCOPY  03/30/2009   ENI:DPOEU polyp/Mild pancolonic diverticulosis/small internal hemorrhoid  . NECK SURGERY     Per patient   Social History   Social History Narrative  . Not on file   Immunization History  Administered Date(s) Administered  . Influenza Whole 08/23/2006, 08/30/2010  . Moderna Sars-Covid-2 Vaccination 07/02/2020, 07/30/2020  . Td 06/16/2004     Objective: Vital Signs: BP 125/67 (BP Location: Right Arm, Patient Position: Sitting, Cuff Size: Small)   Pulse (!) 54   Ht 5' 2.5" (1.588 m)   Wt 112 lb (50.8 kg)   LMP  (LMP Unknown)   BMI 20.16 kg/m    Physical Exam Constitutional:      Comments: Chronically ill appearing  Eyes:     Conjunctiva/sclera: Conjunctivae normal.  Cardiovascular:     Rate and Rhythm: Normal rate and regular rhythm.  Pulmonary:     Effort: Pulmonary effort is normal.     Breath sounds: Normal breath sounds.  Skin:    General: Skin is warm and dry.     Findings: No rash.  Neurological:     General: No focal deficit present.     Mental Status: She is alert.     Musculoskeletal Exam:  Neck full range of motion  Shoulder ROM reduced in  external rotation otherwise intact Elbow extension slightly reduced, approximately 160-170 degrees extension Right wrist extension reduced, 1st CMC tenderness, heberdons noes present with some overlying erythema most on right 2nd DIP Diffuse paraspinal tenderness, s-shaped scoliosis Knees, ankles, MTPs full range of motion, pain to palpation over anterior of left ankle  Ultrasound inspection of left ankle suggestive for some tendonitis with no visualized tear, no obvious joint effusion seen   Investigation: No additional findings.  Imaging: XR Hand 2 View Left  Result Date: 11/12/2020 Xray left hand 2 views Radiocarpal joint space intact. Some probable cystic change in carpal bone.. Mild OA changes including early osteophytes at 3rd MCP, joint space asymmetic narrowing 2nd and 4th DIPs. Generalized osteopenia of the hand. No soft tissue swelling seen. Impression Mild or modeate OA, most prominent in DIPs, generalized demineralization  XR Hand  2 View Right  Result Date: 11/12/2020 Xray right hand 2 views Probably TFC calcification present. Radiocarpal joint space otherwise fairly normal. Appears to be some subluxation of 1st [facility] joint carpal joints otherwise intact. Multiple cystic changes seen in metacarpals. Diffuse osteoarthritis present with osteophytes at MCPs, PIPs, and DIPs. Generalized osteopenia of the hand. No soft tissue swelling seen. Impression Moderately advanced OA diffusely, TFC calcification, general demineralization   Recent Labs: Lab Results  Component Value Date   WBC 9.9 06/19/2016   HGB 13.8 06/19/2016   PLT 355 06/19/2016   NA 141 06/19/2016   K 3.5 06/19/2016   CL 107 06/19/2016   CO2 26 06/19/2016   GLUCOSE 92 06/19/2016   BUN 14 06/19/2016   CREATININE 0.65 06/19/2016   BILITOT 0.7 04/09/2010   ALKPHOS 68 04/09/2010   AST 14 04/09/2010   ALT 11 04/09/2010   PROT 7.1 04/09/2010   ALBUMIN 4.0 04/09/2010   CALCIUM 9.2 06/19/2016   GFRAA >60 06/19/2016     Speciality Comments: No specialty comments available.  Procedures:  No procedures performed Allergies: Aspirin, Atorvastatin, and Sulfonamide derivatives   Assessment / Plan:     Visit Diagnoses: Positive ANA (antinuclear antibody) Bilateral hand pain Chondrocalcinosis Vitamin D deficiency- Plan: XR Hand 2 View Right, XR Hand 2 View Left, ANA, Anti-Smith antibody, Sjogrens syndrome-A extractable nuclear antibody, Sjogrens syndrome-B extractable nuclear antibody, Anti-DNA antibody, double-stranded, C3 and C4  I do not see specific evidence of an active inflammatory arthritis today. She had minimal benefit with prednisone which would also be unusual for inflammatory process. Chondrocalcinosis of multiple sites is suggestive CPDD disease might be a cause for her episodes of knee and wrist swelling versus an autoimmune process. Will check ENA serology Smith, SSA, SSB, dsDNA, complement C3 and c4 but not very suspicious for systemic CTD as cause of symptoms currently. Also checking PTH with her vitamin D deficiency, osteoporosis, osteopenia, chondrocalcinosis.   Orders: Orders Placed This Encounter  Procedures  . XR Hand 2 View Right  . XR Hand 2 View Left  . ANA  . Anti-Smith antibody  . Sjogrens syndrome-A extractable nuclear antibody  . Sjogrens syndrome-B extractable nuclear antibody  . Anti-DNA antibody, double-stranded  . C3 and C4  . Parathyroid hormone, intact (no Ca)   No orders of the defined types were placed in this encounter.    Follow-Up Instructions: No follow-ups on file.   Collier Salina, MD  Note - This record has been created using [employer].  Chart creation errors have been sought, but may not always  have been located. Such creation errors do not reflect on  the standard of medical care.

## 2020-11-12 ENCOUNTER — Ambulatory Visit: Payer: Self-pay

## 2020-11-12 ENCOUNTER — Encounter: Payer: Self-pay | Admitting: Internal Medicine

## 2020-11-12 ENCOUNTER — Other Ambulatory Visit: Payer: Self-pay

## 2020-11-12 ENCOUNTER — Ambulatory Visit (INDEPENDENT_AMBULATORY_CARE_PROVIDER_SITE_OTHER): Payer: Medicare Other | Admitting: Internal Medicine

## 2020-11-12 VITALS — BP 125/67 | HR 54 | Ht 62.5 in | Wt 112.0 lb

## 2020-11-12 DIAGNOSIS — R768 Other specified abnormal immunological findings in serum: Secondary | ICD-10-CM

## 2020-11-12 DIAGNOSIS — M419 Scoliosis, unspecified: Secondary | ICD-10-CM

## 2020-11-12 DIAGNOSIS — M1120 Other chondrocalcinosis, unspecified site: Secondary | ICD-10-CM | POA: Diagnosis not present

## 2020-11-12 DIAGNOSIS — E559 Vitamin D deficiency, unspecified: Secondary | ICD-10-CM | POA: Insufficient documentation

## 2020-11-12 DIAGNOSIS — M79641 Pain in right hand: Secondary | ICD-10-CM

## 2020-11-12 DIAGNOSIS — M79642 Pain in left hand: Secondary | ICD-10-CM

## 2020-11-12 NOTE — Patient Instructions (Addendum)
I do not suspect lupus or rheumatoid arthritis as the cause of your current joint pains and swelling but we will check some more specific antibody test to look for evidence of this. We are also checking hand xrays to look if there is calcium deposition in the wrist or other joints which was seen in your previous knee xrays. Also to look if the osteoarthritis in your hands seems to be inflamed more than usual for any reason. We will contact you after looking over these results if additional rheumatology treatment or tests are needed.

## 2020-11-16 LAB — SJOGRENS SYNDROME-A EXTRACTABLE NUCLEAR ANTIBODY: SSA (Ro) (ENA) Antibody, IgG: <1 AI

## 2020-11-16 LAB — ANTI-SMITH ANTIBODY: ENA SM Ab Ser-aCnc: <1 AI

## 2020-11-16 LAB — ANA: Anti Nuclear Antibody (ANA): NEGATIVE

## 2020-11-16 LAB — C3 AND C4
C3 Complement: 150 mg/dL (ref 83–193)
C4 Complement: 24 mg/dL (ref 15–57)

## 2020-11-16 LAB — ANTI-DNA ANTIBODY, DOUBLE-STRANDED: ds DNA Ab: <1 IU/mL

## 2020-11-16 LAB — PARATHYROID HORMONE, INTACT (NO CA): PTH: 40 pg/mL (ref 14–64)

## 2020-11-16 LAB — SJOGRENS SYNDROME-B EXTRACTABLE NUCLEAR ANTIBODY: SSB (La) (ENA) Antibody, IgG: <1 AI

## 2020-11-23 NOTE — Progress Notes (Signed)
Lab tests for autoimmune disease such as lupus were all negative. Interestingly the ANA test, which was positive previously, was also negative now. This may mean it was a transient positive result and is not likely to indicate an ongoing disease. She does have the known osteoarthritis and scoliosis with some degenerative disease of the spine. Calcium and parathyroid hormone are normal, so if she does have joint inflammation from calcium crystals this would be treated with antiinflammatory medicine as needed. No specific autoimmune disease testing or medication needed.

## 2021-05-28 ENCOUNTER — Emergency Department (HOSPITAL_COMMUNITY)
Admission: EM | Admit: 2021-05-28 | Discharge: 2021-05-29 | Disposition: A | Discharge status: Home / Self Care | Payer: Medicare Other | Attending: Emergency Medicine | Admitting: Emergency Medicine

## 2021-05-28 ENCOUNTER — Encounter (HOSPITAL_COMMUNITY): Payer: Self-pay | Admitting: *Deleted

## 2021-05-28 ENCOUNTER — Other Ambulatory Visit: Payer: Self-pay

## 2021-05-28 DIAGNOSIS — Z79899 Other long term (current) drug therapy: Secondary | ICD-10-CM | POA: Diagnosis not present

## 2021-05-28 DIAGNOSIS — F17210 Nicotine dependence, cigarettes, uncomplicated: Secondary | ICD-10-CM | POA: Insufficient documentation

## 2021-05-28 DIAGNOSIS — M25512 Pain in left shoulder: Secondary | ICD-10-CM | POA: Insufficient documentation

## 2021-05-28 DIAGNOSIS — M542 Cervicalgia: Secondary | ICD-10-CM | POA: Insufficient documentation

## 2021-05-28 DIAGNOSIS — I10 Essential (primary) hypertension: Secondary | ICD-10-CM | POA: Insufficient documentation

## 2021-05-28 MED ORDER — HYDROMORPHONE HCL 1 MG/ML IJ SOLN
1.0000 mg | Freq: Once | INTRAMUSCULAR | Status: AC
  Administered 2021-05-28: 1 mg via INTRAMUSCULAR
  Filled 2021-05-28: qty 1

## 2021-05-28 MED ORDER — CYCLOBENZAPRINE HCL 10 MG PO TABS
5.0000 mg | ORAL_TABLET | Freq: Once | ORAL | Status: AC
  Administered 2021-05-28: 5 mg via ORAL
  Filled 2021-05-28: qty 1

## 2021-05-28 MED ORDER — KETOROLAC TROMETHAMINE 60 MG/2ML IM SOLN
30.0000 mg | Freq: Once | INTRAMUSCULAR | Status: AC
  Administered 2021-05-28: 30 mg via INTRAMUSCULAR
  Filled 2021-05-28: qty 2

## 2021-05-28 NOTE — ED Triage Notes (Signed)
Pt has had neck stiffness and swelling since 7/6. States she has been to her MD for this before and "he stuck a needle in it and it hasn't gotten any better." For this episode, pt has been to her MD and was prescribed percocet which has been ineffective in relieving her pain. Pt is tearful, and states the pain is radiating down her left arm.

## 2021-05-29 MED ORDER — CYCLOBENZAPRINE HCL 10 MG PO TABS: 10.0000 mg | ORAL_TABLET | Freq: Two times a day (BID) | ORAL | 0 refills | Status: DC | PRN

## 2021-05-29 MED ORDER — MELOXICAM 7.5 MG PO TABS: 7.5000 mg | ORAL_TABLET | Freq: Every day | ORAL | 0 refills | Status: DC | PRN

## 2021-05-29 NOTE — ED Provider Notes (Signed)
[facility] EMERGENCY DEPARTMENT Provider Note   CSN: [id number] Arrival date & time: 05/28/21  2259     History Chief Complaint  Patient presents with   Torticollis    Lori Singh is a 65 y.o. female.  Patient with left shoulder and neck pain. Given percocet by pcp. Doesn't help. Worse with movement. No trauma. No infections. No other associated symptoms.        Past Medical History:  Diagnosis Date   Allergies    inhalants   Arm pain    Arm weakness    Arthritis    Back pain    Chronic, Bilateral   Balance disorder    DDD (degenerative disc disease), lumbar    Foot swelling    Gastritis    Hand swelling    Heart murmur    Hyperlipidemia    Hypertension    Joint pain    Joint swelling    Leg pain    Leg weakness    Loss of coordination    Arms   Neck pain    Radiculopathy, lumbar region    Ringing in ears    Sagittal plane imbalance    Scoliosis of thoracolumbar spine    Idiopathic    Patient Active Problem List   Diagnosis Date Noted   Bilateral hand pain 11/12/2020   Chondrocalcinosis 11/12/2020   Positive ANA (antinuclear antibody) 11/12/2020   Vitamin D deficiency 11/12/2020   Sacroiliitis ([place]) 01/15/2019   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   Lumbar scoliosis 09/04/2018   Suicidal ideation 06/20/2016   Cocaine abuse ([place]) 06/20/2016   Chest pain 06/19/2016   OTITIS MEDIA, LEFT 11/10/2010   NECK PAIN, CHRONIC 02/15/2010   OSTEOPOROSIS 11/30/2009   BLOOD CHEMISTRY, ABNORMAL 05/07/2009   COLONIC POLYPS, ADENOMATOUS 05/01/2009   DIVERTICULOSIS OF COLON 51/76/1607   HELICOBACTER PYLORI INFECTION 03/20/2009   CONSTIPATION 03/20/2009   FATIGUE 12/10/2008   HYPERLIPIDEMIA 01/31/2008   Essential hypertension, benign 01/31/2008   ALLERGIC RHINITIS 01/31/2008   DEPRESSION, CHRONIC 12/11/2007   BACK PAIN, CHRONIC 12/11/2007    Past Surgical History:  Procedure Laterality Date   BACK SURGERY     COLONOSCOPY  03/30/2009    PXT:GGYIR polyp/Mild pancolonic diverticulosis/small internal hemorrhoid   NECK SURGERY     Per patient     OB History   No obstetric history on file.     Family History  Problem Relation Age of Onset   Kidney failure Mother    Stroke Father    Stroke Sister    COPD Brother    Heart disease Brother    COPD Sister    Alcohol abuse Brother    Alcohol abuse Brother    Scoliosis Brother    Schizophrenia Brother    Heart attack Sister    Heart disease Sister     Social History   Tobacco Use   Smoking status: Every Day    Packs/day: 0.05    Pack years: 0.00    Types: Cigarettes   Smokeless tobacco: Never   Tobacco comments:    1 cigarette/day  Vaping Use   Vaping Use: Never used  Substance Use Topics   Alcohol use: No   Drug use: No    Home Medications Prior to Admission medications   Medication Sig Start Date End Date Taking? Authorizing Provider  cyclobenzaprine (FLEXERIL) 10 MG tablet Take 1 tablet (10 mg total) by mouth 2 (two) times daily as needed for muscle spasms. 05/29/21  Yes Idell Hissong,  Corene Cornea, MD  meloxicam (MOBIC) 7.5 MG tablet Take 1 tablet (7.5 mg total) by mouth daily as needed for pain. 05/29/21  Yes Demaria Deeney, Corene Cornea, MD  amitriptyline (ELAVIL) 25 MG tablet Take 25 mg by mouth daily. 10/01/20   [provider]  amLODipine (NORVASC) 5 MG tablet Take 5 mg by mouth daily.    [provider]  benazepril (LOTENSIN) 40 MG tablet Take by mouth.    [provider]  hydrochlorothiazide (MICROZIDE) 12.5 MG capsule TK 1 C PO QAM FOR BLOOD PRESSURE 07/02/19   [provider]  metoprolol succinate (TOPROL-XL) 25 MG 24 hr tablet Take 25 mg by mouth daily. 08/03/20   [provider]  sertraline (ZOLOFT) 50 MG tablet Take 50 mg by mouth daily. 10/01/20   [provider]    Allergies    Aspirin, Atorvastatin, and Sulfonamide derivatives  Review of Systems   Review of Systems  All other systems reviewed and are  negative.  Physical Exam Updated Vital Signs BP 130/62   Pulse 65   Temp 98.5 F (36.9 C) (Oral)   Resp 17   Ht 5\' 2"  (1.575 m)   Wt 50.8 kg   LMP  (LMP Unknown)   SpO2 100%   BMI 20.49 kg/m   Physical Exam Vitals and nursing note reviewed.  Constitutional:      Appearance: She is well-developed.  HENT:     Head: Normocephalic and atraumatic.     Mouth/Throat:     Mouth: Mucous membranes are moist.  Eyes:     Pupils: Pupils are equal, round, and reactive to light.  Cardiovascular:     Rate and Rhythm: Normal rate and regular rhythm.  Pulmonary:     Effort: No respiratory distress.     Breath sounds: No stridor.  Abdominal:     General: Abdomen is flat. There is no distension.  Musculoskeletal:        General: Tenderness (left cervical, left shoulder, left trapezius) present.     Cervical back: Normal range of motion.  Skin:    General: Skin is warm and dry.  Neurological:     General: No focal deficit present.     Mental Status: She is alert.    ED Results / Procedures / Treatments   Labs (all labs ordered are listed, but only abnormal results are displayed) Labs Reviewed - No data to display  EKG None  Radiology No results found.  Procedures Procedures   Medications Ordered in ED Medications  HYDROmorphone (DILAUDID) injection 1 mg (1 mg Intramuscular Given 05/28/21 2330)  ketorolac (TORADOL) injection 30 mg (30 mg Intramuscular Given 05/28/21 2333)  cyclobenzaprine (FLEXERIL) tablet 5 mg (5 mg Oral Given 05/28/21 2329)    ED Course  I have reviewed the triage vital signs and the nursing notes.  Pertinent labs & imaging results that were available during my care of the patient were reviewed by me and considered in my medical decision making (see chart for details).    MDM Rules/Calculators/A&P                          Significant improvement with meds here. Dc on nsaids and flexeril.  Final Clinical Impression(s) / ED Diagnoses Final diagnoses:   Neck pain    Rx / DC Orders ED Discharge Orders          Ordered    meloxicam (MOBIC) 7.5 MG tablet  Daily PRN  05/29/21 0006    cyclobenzaprine (FLEXERIL) 10 MG tablet  2 times daily PRN        05/29/21 0006             Meryle Pugmire, Corene Cornea, MD 05/29/21 [id number]

## 2021-08-06 ENCOUNTER — Other Ambulatory Visit: Payer: Self-pay

## 2021-08-06 ENCOUNTER — Emergency Department (HOSPITAL_COMMUNITY)
Admission: EM | Admit: 2021-08-06 | Discharge: 2021-08-06 | Disposition: A | Discharge status: Home / Self Care | Payer: Medicare Other | Attending: Emergency Medicine | Admitting: Emergency Medicine

## 2021-08-06 ENCOUNTER — Encounter (HOSPITAL_COMMUNITY): Payer: Self-pay

## 2021-08-06 DIAGNOSIS — M542 Cervicalgia: Secondary | ICD-10-CM | POA: Diagnosis present

## 2021-08-06 DIAGNOSIS — I10 Essential (primary) hypertension: Secondary | ICD-10-CM | POA: Insufficient documentation

## 2021-08-06 DIAGNOSIS — F17210 Nicotine dependence, cigarettes, uncomplicated: Secondary | ICD-10-CM | POA: Insufficient documentation

## 2021-08-06 DIAGNOSIS — M546 Pain in thoracic spine: Secondary | ICD-10-CM | POA: Diagnosis not present

## 2021-08-06 DIAGNOSIS — Z79899 Other long term (current) drug therapy: Secondary | ICD-10-CM | POA: Diagnosis not present

## 2021-08-06 DIAGNOSIS — M549 Dorsalgia, unspecified: Secondary | ICD-10-CM

## 2021-08-06 MED ORDER — PREDNISONE 10 MG PO TABS: 30.0000 mg | ORAL_TABLET | Freq: Every day | ORAL | 0 refills | Status: DC

## 2021-08-06 MED ORDER — HYDROMORPHONE HCL 1 MG/ML IJ SOLN
1.0000 mg | Freq: Once | INTRAMUSCULAR | Status: AC
  Administered 2021-08-06: 1 mg via INTRAMUSCULAR
  Filled 2021-08-06: qty 1

## 2021-08-06 MED ORDER — OXYCODONE-ACETAMINOPHEN 5-325 MG PO TABS
1.0000 | ORAL_TABLET | Freq: Three times a day (TID) | ORAL | 0 refills | Status: AC | PRN
Start: 2021-08-06 — End: 2021-08-10

## 2021-08-06 NOTE — ED Provider Notes (Signed)
[facility] EMERGENCY DEPARTMENT Provider Note   CSN: [id number] Arrival date & time: 08/06/21  1843     History Chief Complaint  Patient presents with   Neck Pain    Lori Singh is a 65 y.o. female.  HPI  Patient with significant medical history of chronic back pain, DDD of the lumbar spine, chronic neck pain, scoliosis presents with chief complaint of neck pain.  Patient states pain started yesterday, this started after she was walking her dog and her dog yanked her causing her to have a whip lash like injury to her neck.  She states since then she is to having neck pain which will radiate down her back, she will have pain that radiates into her arms bilaterally with occasional paresthesias in her arms and legs which she states is chronic.  She has no headaches, change in vision, weakness in the upper and or lower extremities, she denies urinary symptoms, incontinence, retention, difficult bowel movements, she denies saddle paresthesias.  She denies history of IV drug use, denies associated fevers or chills.  She has no chest pain or shortness of breath or worsening pedal edema.  She states she is taking some muscle laxer without much relief.  She has no other complaints at this time.  Past Medical History:  Diagnosis Date   Allergies    inhalants   Arm pain    Arm weakness    Arthritis    Back pain    Chronic, Bilateral   Balance disorder    DDD (degenerative disc disease), lumbar    Foot swelling    Gastritis    Hand swelling    Heart murmur    Hyperlipidemia    Hypertension    Joint pain    Joint swelling    Leg pain    Leg weakness    Loss of coordination    Arms   Neck pain    Radiculopathy, lumbar region    Ringing in ears    Sagittal plane imbalance    Scoliosis of thoracolumbar spine    Idiopathic    Patient Active Problem List   Diagnosis Date Noted   Bilateral hand pain 11/12/2020   Chondrocalcinosis 11/12/2020   Positive ANA (antinuclear  antibody) 11/12/2020   Vitamin D deficiency 11/12/2020   Sacroiliitis ([place]) 01/15/2019   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   Lumbar scoliosis 09/04/2018   Suicidal ideation 06/20/2016   Cocaine abuse ([place]) 06/20/2016   Chest pain 06/19/2016   OTITIS MEDIA, LEFT 11/10/2010   NECK PAIN, CHRONIC 02/15/2010   OSTEOPOROSIS 11/30/2009   BLOOD CHEMISTRY, ABNORMAL 05/07/2009   COLONIC POLYPS, ADENOMATOUS 05/01/2009   DIVERTICULOSIS OF COLON 0000000   HELICOBACTER PYLORI INFECTION 03/20/2009   CONSTIPATION 03/20/2009   FATIGUE 12/10/2008   HYPERLIPIDEMIA 01/31/2008   Essential hypertension, benign 01/31/2008   ALLERGIC RHINITIS 01/31/2008   DEPRESSION, CHRONIC 12/11/2007   BACK PAIN, CHRONIC 12/11/2007    Past Surgical History:  Procedure Laterality Date   BACK SURGERY     COLONOSCOPY  03/30/2009   [id number] polyp/Mild pancolonic diverticulosis/small internal hemorrhoid   NECK SURGERY     Per patient     OB History   No obstetric history on file.     Family History  Problem Relation Age of Onset   Kidney failure Mother    Stroke Father    Stroke Sister    COPD Brother    Heart disease Brother    COPD Sister    Alcohol  abuse Brother    Alcohol abuse Brother    Scoliosis Brother    Schizophrenia Brother    Heart attack Sister    Heart disease Sister     Social History   Tobacco Use   Smoking status: Every Day    Packs/day: 0.05    Types: Cigarettes   Smokeless tobacco: Never   Tobacco comments:    1 cigarette/day  Vaping Use   Vaping Use: Never used  Substance Use Topics   Alcohol use: No   Drug use: No    Home Medications Prior to Admission medications   Medication Sig Start Date End Date Taking? Authorizing Provider  oxyCODONE-acetaminophen (PERCOCET/ROXICET) 5-325 MG tablet Take 1 tablet by mouth every 8 (eight) hours as needed for up to 4 days for severe pain. 08/06/21 08/10/21 Yes Marcello Fennel, PA-C  predniSONE (DELTASONE) 10 MG  tablet Take 3 tablets (30 mg total) by mouth daily. 08/06/21  Yes Marcello Fennel, PA-C  amitriptyline (ELAVIL) 25 MG tablet Take 25 mg by mouth daily. 10/01/20   [provider]  amLODipine (NORVASC) 5 MG tablet Take 5 mg by mouth daily.    [provider]  benazepril (LOTENSIN) 40 MG tablet Take by mouth.    [provider]  cyclobenzaprine (FLEXERIL) 10 MG tablet Take 1 tablet (10 mg total) by mouth 2 (two) times daily as needed for muscle spasms. 05/29/21   Mesner, Corene Cornea, MD  hydrochlorothiazide (MICROZIDE) 12.5 MG capsule TK 1 C PO QAM FOR BLOOD PRESSURE 07/02/19   [provider]  meloxicam (MOBIC) 7.5 MG tablet Take 1 tablet (7.5 mg total) by mouth daily as needed for pain. 05/29/21   Mesner, Corene Cornea, MD  metoprolol succinate (TOPROL-XL) 25 MG 24 hr tablet Take 25 mg by mouth daily. 08/03/20   [provider]  sertraline (ZOLOFT) 50 MG tablet Take 50 mg by mouth daily. 10/01/20   [provider]    Allergies    Aspirin, Atorvastatin, and Sulfonamide derivatives  Review of Systems   Review of Systems  Constitutional:  Negative for chills and fever.  HENT:  Negative for congestion.   Respiratory:  Negative for shortness of breath.   Cardiovascular:  Negative for chest pain.  Gastrointestinal:  Negative for abdominal pain.  Genitourinary:  Negative for enuresis.  Musculoskeletal:  Positive for back pain and neck pain.  Skin:  Negative for rash.  Neurological:  Negative for dizziness, weakness and headaches.  Hematological:  Does not bruise/bleed easily.   Physical Exam Updated Vital Signs BP (!) 121/59 (BP Location: Left Arm)   Pulse 73   Temp 100 F (37.8 C) (Oral)   Resp 16   Ht '5\' 2"'$  (1.575 m)   Wt 49.9 kg   LMP  (LMP Unknown)   SpO2 100%   BMI 20.12 kg/m   Physical Exam Vitals and nursing note reviewed.  Constitutional:      General: She is in acute distress.     Appearance: She is not ill-appearing.     Comments:  Patient was found pacing around the room, to be in acute distress.  HENT:     Head: Normocephalic and atraumatic.     Nose: No congestion.  Eyes:     Conjunctiva/sclera: Conjunctivae normal.  Cardiovascular:     Rate and Rhythm: Normal rate and regular rhythm.     Pulses: Normal pulses.     Heart sounds: No murmur heard.   No friction rub. No gallop.  Pulmonary:  Effort: No respiratory distress.     Breath sounds: No wheezing, rhonchi or rales.  Musculoskeletal:     Comments: Patient has full range of motion, 5 of 5 strength neurovascular tact in the upper and lower extremities.  Spine was palpated there was tenderness to palpation along the entire spine, no step-off or deformities present.  Skin:    General: Skin is warm and dry.  Neurological:     Mental Status: She is alert.  Psychiatric:        Mood and Affect: Mood normal.    ED Results / Procedures / Treatments   Labs (all labs ordered are listed, but only abnormal results are displayed) Labs Reviewed - No data to display  EKG None  Radiology No results found.  Procedures Procedures   Medications Ordered in ED Medications  HYDROmorphone (DILAUDID) injection 1 mg (1 mg Intramuscular Given 08/06/21 2133)    ED Course  I have reviewed the triage vital signs and the nursing notes.  Pertinent labs & imaging results that were available during my care of the patient were reviewed by me and considered in my medical decision making (see chart for details).    MDM Rules/Calculators/A&P                          Initial impression-patient presents with neck and back pain.  She is alert, appears to be in acute distress, vital signs reassuring.  Suspect acute on chronic back and neck pain will provide with Dilaudid and reassess.  Work-up-due to well-appearing patient, benign physical exam, further lab work and imaging are not warranted at this time.  Reassessment -patient was reassessed after Dilaudid, she is  resting comfortably in her chair, has no complaints this time, vital signs reassuring, patient agreeable for discharge  Rule out- I have low suspicion for spinal fracture or spinal cord abnormality as patient denies urinary incontinency, retention, difficulty with bowel movements, denies saddle paresthesias.  Spine was palpated there is no step-off, crepitus or gross deformities felt, patient had 5/5 strength, full range of motion, neurovascular fully intact in the lower extremities.  Will defer imaging as there is no significant trauma associated with this episode of pain. Low suspicion for septic arthritis as patient denies IV drug use, skin exam was performed no erythematous, edema or warm joints noted.  Low suspicion for UTI, pyelonephritis, kidney stone patient denies urinary symptoms, she has no CVA tenderness.  Low suspicion for AAA and/or dissection of the aorta as presentation atypical of etiology.   Plan-  Neck and back pain-likely is acute on chronic, will provide her with a short course of pain medications, steroids, follow-up with PCP for further evaluation.  Vital signs have remained stable, no indication for hospital admission.   Patient given at home care as well strict return precautions.  Patient verbalized that they understood agreed to said plan.  Final Clinical Impression(s) / ED Diagnoses Final diagnoses:  Neck pain  Acute midline back pain, unspecified back location    Rx / DC Orders ED Discharge Orders          Ordered    oxyCODONE-acetaminophen (PERCOCET/ROXICET) 5-325 MG tablet  Every 8 hours PRN        08/06/21 2243    predniSONE (DELTASONE) 10 MG tablet  Daily        08/06/21 2243             Marcello Fennel, PA-C 08/06/21 2245  Fredia Sorrow, MD 08/13/21 [phone]

## 2021-08-06 NOTE — ED Triage Notes (Signed)
Pt arrived via POV c/o muscle spasms in her neck that are shooting down her back. Pt reports walking her dog yesterday when the dog jerk the leash and Pt reports feeling like she got 'whip lash'. Pt reports taking 2 flexiril at home without relief and has been alternating ice and heat without relief.

## 2021-08-06 NOTE — Discharge Instructions (Addendum)
I have given you a short course of narcotics please take as prescribed.  This medication can make you drowsy do not consume alcohol or operate heavy machinery when taking this medication.  This medication is Tylenol in it do not take Tylenol and take this medication.  I have also started you on steroids please take as prescribed.  Please follow-up with your PCP for further evaluation.  Come back to the emergency department if you develop chest pain, shortness of breath, severe abdominal pain, uncontrolled nausea, vomiting, diarrhea.

## 2021-10-05 ENCOUNTER — Encounter: Payer: Self-pay | Admitting: Internal Medicine

## 2021-11-11 ENCOUNTER — Ambulatory Visit: Payer: Medicare Other

## 2021-12-02 ENCOUNTER — Other Ambulatory Visit: Payer: Self-pay

## 2021-12-02 ENCOUNTER — Ambulatory Visit (INDEPENDENT_AMBULATORY_CARE_PROVIDER_SITE_OTHER): Payer: Self-pay | Admitting: *Deleted

## 2021-12-02 VITALS — Ht 62.0 in | Wt 110.0 lb

## 2021-12-02 DIAGNOSIS — Z86010 Personal history of colonic polyps: Secondary | ICD-10-CM

## 2021-12-02 NOTE — Progress Notes (Signed)
Gastroenterology Pre-Procedure Review  Request Date: 12/02/2021 Requesting Physician: Dr. Marlon Pel, Last TCS done 03/30/2009 by Dr. Oneida Alar, mild pancolonic diverticulosis, small internal hemorrhoids, tubular adenoma  PATIENT REVIEW QUESTIONS: The patient responded to the following health history questions as indicated:    1. Diabetes Melitis: no 2. Joint replacements in the past 12 months: no 3. Major health problems in the past 3 months: yes, chronic kidney disease, managed by Dr. Marlon Pel 4. Has an artificial valve or MVP: no 5. Has a defibrillator: no 6. Has been advised in past to take antibiotics in advance of a procedure like teeth cleaning: yes 7. Family history of colon cancer:  no 8. Alcohol Use: no 9. Illicit drug Use: no 10. History of sleep apnea: no  11. History of coronary artery or other vascular stents placed within the last 12 months: no 12. History of any prior anesthesia complications: no 13. Body mass index is 20.12 kg/m.    MEDICATIONS & ALLERGIES:    Patient reports the following regarding taking any blood thinners:   Plavix? no Aspirin? no Coumadin? no Brilinta? no Xarelto? no Eliquis? no Pradaxa? no Savaysa? no Effient? no  Patient confirms/reports the following medications:  Current Outpatient Medications  Medication Sig Dispense Refill   amLODipine (NORVASC) 5 MG tablet Take 5 mg by mouth daily.     benazepril (LOTENSIN) 40 MG tablet Take by mouth daily at 6 (six) AM.     Cholecalciferol (VITAMIN D) 50 MCG (2000 UT) tablet Take 2,000 Units by mouth in the morning and at bedtime.     cyclobenzaprine (FLEXERIL) 10 MG tablet Take 1 tablet (10 mg total) by mouth 2 (two) times daily as needed for muscle spasms. (Patient taking differently: Take 10 mg by mouth as needed for muscle spasms.) 20 tablet 0   hydrochlorothiazide (MICROZIDE) 12.5 MG capsule daily at 6 (six) AM.     No current facility-administered medications for this visit.    Patient  confirms/reports the following allergies:  Allergies  Allergen Reactions   Aspirin     REACTION: STOMACH BLEEDS   Atorvastatin Other (See Comments)    REACTION: legs aching REACTION: legs aching   Sulfonamide Derivatives     No orders of the defined types were placed in this encounter.   AUTHORIZATION INFORMATION Primary Insurance: [employer] Medicare,  ID #: [id number],  Group #: [id number] Pre-Cert / Josem Kaufmann required:  Pre-Cert / Auth #:   Secondary Insurance: Medicaid,  ID #: [id number] Pre-Cert / Josem Kaufmann required: No, not required  SCHEDULE INFORMATION: Procedure has been scheduled as follows:  Date: , Time:   Location: [facility] with Dr. Abbey Chatters  This Gastroenterology Pre-Precedure Review Form is being routed to the following provider(s): Aliene Altes, PA-C

## 2021-12-05 NOTE — Progress Notes (Signed)
UDS positive for cocaine in 2017. Recommend OV.

## 2021-12-06 NOTE — Progress Notes (Signed)
Scheduled ov for 12/08/2021 at 9:30 with Aliene Altes, PA-C.

## 2021-12-06 NOTE — Progress Notes (Deleted)
Referring Provider: Leonie Douglas, MD Primary Care Physician:  Leonie Douglas, MD Primary Gastroenterologist:  Dr. Abbey Chatters  No chief complaint on file.   HPI:   Lori Singh is a 66 y.o. female presenting today at the request of Dr. Leonie Douglas for consult colonoscopy. Last TCS done 03/30/2009 by Dr. Oneida Alar, mild pancolonic diverticulosis, small internal hemorrhoids, 8 mm tubular adenoma.   She was initially a nurse triage, but recommended office visit due to UDS positive for cocaine in 2017.  Past Medical History:  Diagnosis Date   Allergies    inhalants   Arm pain    Arm weakness    Arthritis    Back pain    Chronic, Bilateral   Balance disorder    DDD (degenerative disc disease), lumbar    Foot swelling    Gastritis    Hand swelling    Heart murmur    Hyperlipidemia    Hypertension    Joint pain    Joint swelling    Leg pain    Leg weakness    Loss of coordination    Arms   Neck pain    Radiculopathy, lumbar region    Ringing in ears    Sagittal plane imbalance    Scoliosis of thoracolumbar spine    Idiopathic    Past Surgical History:  Procedure Laterality Date   BACK SURGERY     COLONOSCOPY  03/30/2009   [id number] polyp/Mild pancolonic diverticulosis/small internal hemorrhoid   NECK SURGERY     Per patient    Current Outpatient Medications  Medication Sig Dispense Refill   amLODipine (NORVASC) 5 MG tablet Take 5 mg by mouth daily.     benazepril (LOTENSIN) 40 MG tablet Take by mouth daily at 6 (six) AM.     Cholecalciferol (VITAMIN D) 50 MCG (2000 UT) tablet Take 2,000 Units by mouth in the morning and at bedtime.     cyclobenzaprine (FLEXERIL) 10 MG tablet Take 1 tablet (10 mg total) by mouth 2 (two) times daily as needed for muscle spasms. (Patient taking differently: Take 10 mg by mouth as needed for muscle spasms.) 20 tablet 0   hydrochlorothiazide (MICROZIDE) 12.5 MG capsule daily at 6 (six) AM.     No current  facility-administered medications for this visit.    Allergies as of 12/08/2021 - Review Complete 12/02/2021  Allergen Reaction Noted   Aspirin  05/01/2009   Atorvastatin Other (See Comments) 02/15/2010   Sulfonamide derivatives  08/30/2010    Family History  Problem Relation Age of Onset   Kidney failure Mother    Stroke Father    Stroke Sister    COPD Brother    Heart disease Brother    COPD Sister    Alcohol abuse Brother    Alcohol abuse Brother    Scoliosis Brother    Schizophrenia Brother    Heart attack Sister    Heart disease Sister     Social History   Socioeconomic History   Marital status: Divorced    Spouse name: Not on file   Number of children: Not on file   Years of education: Not on file   Highest education level: Not on file  Occupational History   Not on file  Tobacco Use   Smoking status: Every Day    Packs/day: 0.05    Types: Cigarettes   Smokeless tobacco: Never   Tobacco comments:    1 cigarette/day  Vaping Use   Vaping Use: Never used  Substance and Sexual Activity   Alcohol use: No   Drug use: No   Sexual activity: Not Currently  Other Topics Concern   Not on file  Social History Narrative   Not on file   Social Determinants of Health   Financial Resource Strain: Not on file  Food Insecurity: Not on file  Transportation Needs: Not on file  Physical Activity: Not on file  Stress: Not on file  Social Connections: Not on file  Intimate Partner Violence: Not on file    Review of Systems: Gen: Denies any fever, chills, fatigue, weight loss, lack of appetite.  CV: Denies chest pain, heart palpitations, peripheral edema, syncope.  Resp: Denies shortness of breath at rest or with exertion. Denies wheezing or cough.  GI: Denies dysphagia or odynophagia. Denies jaundice, hematemesis, fecal incontinence. GU : Denies urinary burning, urinary frequency, urinary hesitancy MS: Denies joint pain, muscle weakness, cramps, or limitation of  movement.  Derm: Denies rash, itching, dry skin Psych: Denies depression, anxiety, memory loss, and confusion Heme: Denies bruising, bleeding, and enlarged lymph nodes.  Physical Exam: LMP  (LMP Unknown)  General:   Alert and oriented. Pleasant and cooperative. Well-nourished and well-developed.  Head:  Normocephalic and atraumatic. Eyes:  Without icterus, sclera clear and conjunctiva pink.  Ears:  Normal auditory acuity. Nose:  No deformity, discharge,  or lesions. Mouth:  No deformity or lesions, oral mucosa pink.  Neck:  Supple, without mass or thyromegaly. Lungs:  Clear to auscultation bilaterally. No wheezes, rales, or rhonchi. No distress.  Heart:  S1, S2 present without murmurs appreciated.  Abdomen:  +BS, soft, non-tender and non-distended. No HSM noted. No guarding or rebound. No masses appreciated.  Rectal:  Deferred  Msk:  Symmetrical without gross deformities. Normal posture. Pulses:  Normal pulses noted. Extremities:  Without clubbing or edema. Neurologic:  Alert and  oriented x4;  grossly normal neurologically. Skin:  Intact without significant lesions or rashes. Cervical Nodes:  No significant cervical adenopathy. Psych:  Alert and cooperative. Normal mood and affect.

## 2021-12-07 NOTE — Progress Notes (Signed)
Had saved appointment spot for pt.  Left multiple voice mails for pt to confirm but no response as of yet.

## 2021-12-08 ENCOUNTER — Ambulatory Visit: Payer: Medicare Other | Admitting: Gastroenterology

## 2021-12-08 ENCOUNTER — Encounter: Payer: Self-pay | Admitting: *Deleted

## 2021-12-08 NOTE — Progress Notes (Signed)
Pt did not show for appointment today.  Mailing out letter for pt to contact our office.

## 2022-06-04 ENCOUNTER — Other Ambulatory Visit: Payer: Self-pay

## 2022-06-04 ENCOUNTER — Encounter (HOSPITAL_COMMUNITY): Payer: Self-pay

## 2022-06-04 ENCOUNTER — Emergency Department (HOSPITAL_COMMUNITY)
Admission: EM | Admit: 2022-06-04 | Discharge: 2022-06-04 | Disposition: A | Discharge status: Home / Self Care | Payer: Medicare Other | Attending: Emergency Medicine | Admitting: Emergency Medicine

## 2022-06-04 ENCOUNTER — Emergency Department (HOSPITAL_COMMUNITY): Payer: Medicare Other

## 2022-06-04 DIAGNOSIS — M25551 Pain in right hip: Secondary | ICD-10-CM | POA: Diagnosis present

## 2022-06-04 DIAGNOSIS — W010XXA Fall on same level from slipping, tripping and stumbling without subsequent striking against object, initial encounter: Secondary | ICD-10-CM | POA: Insufficient documentation

## 2022-06-04 MED ORDER — DICLOFENAC SODIUM 1 % EX GEL: 4.0000 g | Freq: Four times a day (QID) | CUTANEOUS | 1 refills | Status: DC

## 2022-06-04 MED ORDER — OXYCODONE-ACETAMINOPHEN 5-325 MG PO TABS
1.0000 | ORAL_TABLET | Freq: Once | ORAL | Status: AC
  Administered 2022-06-04: 1 via ORAL
  Filled 2022-06-04: qty 1

## 2022-06-04 MED ORDER — ACETAMINOPHEN 500 MG PO TABS: 1000.0000 mg | ORAL_TABLET | Freq: Four times a day (QID) | ORAL | 0 refills | Status: AC | PRN
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
## 2022-06-04 NOTE — ED Provider Notes (Signed)
[facility] Provider Note   CSN: [id number] Arrival date & time: 06/04/22  1220     History  Chief Complaint  Patient presents with   Lytle Michaels         Lori Singh is a 66 y.o. female.   Fall   Patient is a 66 year old female with past medical history significant for chronic back pain, scoliosis, radicular pain  Patient presents emergency room today with complaints of right hip pain after she fell yesterday while she was washing the car.  She states that her right knee buckled underneath her and she fell to the ground she did not strike her head or lose consciousness she has no nausea vomiting.  She is taken medications for her hip pain which has been achy constant and moderate.  She denies any lacerations abrasions or bleeding.  No other injuries.  She denies any neck or back pain.  Apart from her chronic back pain      Home Medications Prior to Admission medications   Medication Sig Start Date End Date Taking? Authorizing Provider  acetaminophen (TYLENOL) 500 MG tablet Take 2 tablets (1,000 mg total) by mouth every 6 (six) hours as needed. 06/04/22  Yes Chandan Fly S, PA  diclofenac Sodium (VOLTAREN) 1 % GEL Apply 4 g topically 4 (four) times daily. 06/04/22  Yes Aizen Duval S, PA  amLODipine (NORVASC) 5 MG tablet Take 5 mg by mouth daily.    [provider]  benazepril (LOTENSIN) 40 MG tablet Take by mouth daily at 6 (six) AM.    [provider]  Cholecalciferol (VITAMIN D) 50 MCG (2000 UT) tablet Take 2,000 Units by mouth in the morning and at bedtime.    [provider]  cyclobenzaprine (FLEXERIL) 10 MG tablet Take 1 tablet (10 mg total) by mouth 2 (two) times daily as needed for muscle spasms. Patient taking differently: Take 10 mg by mouth as needed for muscle spasms. 05/29/21   Mesner, Corene Cornea, MD  hydrochlorothiazide (MICROZIDE) 12.5 MG capsule daily at 6 (six) AM. 07/02/19   [provider]      Allergies     Aspirin, Atorvastatin, and Sulfonamide derivatives    Review of Systems   Review of Systems  Physical Exam Updated Vital Signs BP (!) 172/61 (BP Location: Right Arm)   Pulse (!) 56   Temp 97.8 F (36.6 C) (Oral)   Resp 18   Ht 5' 2.5" (1.588 m)   Wt 49.9 kg   LMP  (LMP Unknown)   SpO2 100%   BMI 19.80 kg/m  Physical Exam Vitals and nursing note reviewed.  Constitutional:      General: She is not in acute distress.    Appearance: Normal appearance. She is not ill-appearing.  HENT:     Head: Normocephalic and atraumatic.  Eyes:     General: No scleral icterus.       Right eye: No discharge.        Left eye: No discharge.     Conjunctiva/sclera: Conjunctivae normal.  Pulmonary:     Effort: Pulmonary effort is normal.     Breath sounds: No stridor.  Musculoskeletal:     Comments: Right hip with some mild tenderness to palpation.  No bruising or deformity   No other bony tenderness over joints or long bones of the upper and lower extremities.    No neck or back midline tenderness, step-off, deformity, or bruising.   Full range of motion of upper and  lower extremity joints shown after palpation was conducted; with 5/5 symmetrical strength in upper and lower extremities. No chest wall tenderness, no facial or cranial tenderness.   Patient has intact sensation grossly in lower and upper extremities. Intact patellar and ankle reflexes. Patient able to ambulate without difficulty.  Radial and DP pulses palpated BL.    Neurological:     Mental Status: She is alert and oriented to person, place, and time. Mental status is at baseline.     ED Results / Procedures / Treatments   Labs (all labs ordered are listed, but only abnormal results are displayed) Labs Reviewed - No data to display  EKG None  Radiology DG Hip Unilat  With Pelvis 2-3 Views Right  Result Date: 06/04/2022 CLINICAL DATA:  Trauma, fall EXAM: DG HIP (WITH OR WITHOUT PELVIS) 2-3V RIGHT COMPARISON:  None  Available. FINDINGS: No displaced fracture or dislocation is seen. Degenerative changes are noted with bony spurs in the right hip. Bony spurs are also noted in the left hip in the AP view of pelvis. The deformity in left iliac bone, possibly residual from previous trauma or previous surgery. Degenerative changes are noted in visualized lower lumbar spine. Bony spurs are seen at the pubic symphysis. IMPRESSION: No displaced fracture or dislocation is seen. Degenerative changes are noted in both hips and the lumbar spine. Electronically Signed   By: Elmer Picker M.D.   On: 06/04/2022 13:40    Procedures Procedures    Medications Ordered in ED Medications  oxyCODONE-acetaminophen (PERCOCET/ROXICET) 5-325 MG per tablet 1 tablet (has no administration in time range)    ED Course/ Medical Decision Making/ A&P Clinical Course as of 06/04/22 1451  Sat Jun 04, 2022  1351 IMPRESSION: No displaced fracture or dislocation is seen. Degenerative changes are noted in both hips and the lumbar spine.   Electronically Signed   By: Elmer Picker M.D.   On: 06/04/2022 13:40   [WF]    Clinical Course User Index [WF] Tedd Sias, PA                           Medical Decision Making Amount and/or Complexity of Data Reviewed Radiology: ordered.  Risk OTC drugs. Prescription drug management.   Patient is a 66 year old female with past medical history significant for chronic back pain, scoliosis, radicular pain  Patient presents emergency room today with complaints of right hip pain after she fell yesterday while she was washing the car.  She states that her right knee buckled underneath her and she fell to the ground she did not strike her head or lose consciousness she has no nausea vomiting.  She is taken medications for her hip pain which has been achy constant and moderate.  She denies any lacerations abrasions or bleeding.  No other injuries.  She denies any neck or back  pain.  Apart from her chronic back pain    I personally viewed images agree w rad read.   IMPRESSION: No displaced fracture or dislocation is seen. Degenerative changes are noted in both hips and the lumbar spine.   Pt is distally neurovascularly intact and walking.  Offered CT of hip which she declined. Will follow up with orthopedics/PCP   Final Clinical Impression(s) / ED Diagnoses Final diagnoses:  Right hip pain    Rx / DC Orders ED Discharge Orders          Ordered    diclofenac Sodium (VOLTAREN)  1 % GEL  4 times daily        06/04/22 1444    acetaminophen (TYLENOL) 500 MG tablet  Every 6 hours PRN        06/04/22 1444              Tedd Sias, [place] 06/04/22 1451    Milton Ferguson, MD 06/05/22 1709

## 2022-06-04 NOTE — Discharge Instructions (Signed)
Ice and Voltaren gel applied to your hip can help with the pain.  Tylenol 1000 mg every 6 hours.  Please follow-up with an orthopedist or your primary care provider.

## 2022-06-15 ENCOUNTER — Other Ambulatory Visit (HOSPITAL_COMMUNITY): Payer: Self-pay | Admitting: Sports Medicine

## 2022-06-15 ENCOUNTER — Other Ambulatory Visit (HOSPITAL_COMMUNITY): Payer: Self-pay | Admitting: *Deleted

## 2022-06-15 DIAGNOSIS — Z1231 Encounter for screening mammogram for malignant neoplasm of breast: Secondary | ICD-10-CM

## 2022-06-16 ENCOUNTER — Other Ambulatory Visit: Payer: Self-pay | Admitting: Sports Medicine

## 2022-06-16 ENCOUNTER — Other Ambulatory Visit (HOSPITAL_COMMUNITY): Payer: Self-pay | Admitting: Sports Medicine

## 2022-06-16 DIAGNOSIS — M533 Sacrococcygeal disorders, not elsewhere classified: Secondary | ICD-10-CM

## 2022-06-20 ENCOUNTER — Encounter: Payer: Self-pay | Admitting: *Deleted

## 2022-06-21 ENCOUNTER — Ambulatory Visit (HOSPITAL_COMMUNITY)
Admission: RE | Admit: 2022-06-21 | Discharge: 2022-06-21 | Disposition: A | Discharge status: Home / Self Care | Payer: Medicare Other | Source: Ambulatory Visit | Attending: Sports Medicine | Admitting: Sports Medicine

## 2022-06-21 DIAGNOSIS — M160 Bilateral primary osteoarthritis of hip: Secondary | ICD-10-CM | POA: Diagnosis not present

## 2022-06-21 DIAGNOSIS — M533 Sacrococcygeal disorders, not elsewhere classified: Secondary | ICD-10-CM | POA: Diagnosis present

## 2022-06-21 DIAGNOSIS — M25551 Pain in right hip: Secondary | ICD-10-CM | POA: Diagnosis not present

## 2022-06-21 DIAGNOSIS — M47816 Spondylosis without myelopathy or radiculopathy, lumbar region: Secondary | ICD-10-CM | POA: Insufficient documentation

## 2022-06-27 ENCOUNTER — Ambulatory Visit (HOSPITAL_COMMUNITY)
Admission: RE | Admit: 2022-06-27 | Discharge: 2022-06-27 | Disposition: A | Discharge status: Home / Self Care | Payer: Medicare Other | Source: Ambulatory Visit | Attending: Sports Medicine | Admitting: Sports Medicine

## 2022-06-27 DIAGNOSIS — Z1231 Encounter for screening mammogram for malignant neoplasm of breast: Secondary | ICD-10-CM | POA: Diagnosis present

## 2022-06-29 ENCOUNTER — Other Ambulatory Visit (HOSPITAL_COMMUNITY): Payer: Self-pay | Admitting: Sports Medicine

## 2022-07-04 ENCOUNTER — Other Ambulatory Visit (HOSPITAL_COMMUNITY): Payer: Self-pay | Admitting: Sports Medicine

## 2022-07-04 DIAGNOSIS — R928 Other abnormal and inconclusive findings on diagnostic imaging of breast: Secondary | ICD-10-CM

## 2022-07-11 ENCOUNTER — Ambulatory Visit (HOSPITAL_COMMUNITY)
Admission: RE | Admit: 2022-07-11 | Discharge: 2022-07-11 | Disposition: A | Discharge status: Home / Self Care | Payer: Medicare Other | Source: Ambulatory Visit | Attending: Sports Medicine | Admitting: Sports Medicine

## 2022-07-11 DIAGNOSIS — R928 Other abnormal and inconclusive findings on diagnostic imaging of breast: Secondary | ICD-10-CM | POA: Insufficient documentation

## 2022-11-28 ENCOUNTER — Other Ambulatory Visit (HOSPITAL_COMMUNITY): Payer: Self-pay | Admitting: Family Medicine

## 2022-11-28 DIAGNOSIS — N6489 Other specified disorders of breast: Secondary | ICD-10-CM

## 2022-11-29 ENCOUNTER — Encounter: Payer: Self-pay | Admitting: *Deleted

## 2022-12-13 ENCOUNTER — Ambulatory Visit (HOSPITAL_COMMUNITY): Admission: RE | Admit: 2022-12-13 | Payer: 59 | Source: Ambulatory Visit

## 2022-12-13 ENCOUNTER — Encounter (HOSPITAL_COMMUNITY): Payer: Self-pay

## 2022-12-13 ENCOUNTER — Encounter (HOSPITAL_COMMUNITY): Payer: Medicare Other

## 2023-01-11 ENCOUNTER — Encounter: Payer: Self-pay | Admitting: Emergency Medicine

## 2023-01-11 ENCOUNTER — Ambulatory Visit
Admission: EM | Admit: 2023-01-11 | Discharge: 2023-01-11 | Disposition: A | Discharge status: Home / Self Care | Payer: 59 | Attending: Nurse Practitioner | Admitting: Nurse Practitioner

## 2023-01-11 ENCOUNTER — Other Ambulatory Visit: Payer: Self-pay

## 2023-01-11 DIAGNOSIS — Z20822 Contact with and (suspected) exposure to covid-19: Secondary | ICD-10-CM

## 2023-01-11 DIAGNOSIS — J069 Acute upper respiratory infection, unspecified: Secondary | ICD-10-CM

## 2023-01-11 DIAGNOSIS — Z1152 Encounter for screening for COVID-19: Secondary | ICD-10-CM | POA: Diagnosis not present

## 2023-01-11 MED ORDER — BENZONATATE 100 MG PO CAPS: 100.0000 mg | ORAL_CAPSULE | Freq: Three times a day (TID) | ORAL | 0 refills | Status: DC | PRN

## 2023-01-11 NOTE — Discharge Instructions (Addendum)
You have a viral upper respiratory infection.  Symptoms should improve over the next week to 10 days.  If you develop chest pain or shortness of breath, go to the emergency room.  We have tested you today for COVID-19.  You will see the results in Mychart and we will call you with positive results.  Please stay home and isolate until you are aware of the results.    Some things that can make you feel better are: - Increased rest - Increasing fluid with water/sugar free electrolytes - Acetaminophen and ibuprofen as needed for fever/pain - Salt water gargling, chloraseptic spray and throat lozenges for sore throat - OTC guaifenesin (Mucinex) 600 mg twice daily for congestion - Saline sinus flushes or a neti pot - Humidifying the air -Tessalon Perles during the day as needed for dry cough

## 2023-01-11 NOTE — ED Triage Notes (Addendum)
Pt reports nasal congestion, body aches, loss of taste, "swollen glands" since yesterday. Pt reports grandchild tested positive for covid last Thursday.

## 2023-01-11 NOTE — ED Provider Notes (Signed)
[facility]    CSN: [id number] Arrival date & time: 01/11/23  1234      History   Chief Complaint Chief Complaint  Patient presents with   Nasal Congestion    HPI Lori Singh is a 67 y.o. female.   Patient presents today for 1 day history of bodyaches, joint pain in all of her joints, congested cough, runny and stuffy nose, sore throat, swollen glands in her neck, decreased appetite, loss of taste, and fatigue.  She denies known fevers, shortness of breath or chest pain, chest tightness, headache or ear pain, abdominal pain, nausea/vomiting, and diarrhea.  Has taken Tylenol for symptoms which helps a little bit.  Reports her grandchild tested positive for COVID-19 last week and she is a primary caregiver for them.  Reports she is never tested positive for COVID-19.  She has had multiple vaccines.  Reports she takes blood pressure medication.    Past Medical History:  Diagnosis Date   Allergies    inhalants   Arm pain    Arm weakness    Arthritis    Back pain    Chronic, Bilateral   Balance disorder    DDD (degenerative disc disease), lumbar    Foot swelling    Gastritis    Hand swelling    Heart murmur    Hyperlipidemia    Hypertension    Joint pain    Joint swelling    Leg pain    Leg weakness    Loss of coordination    Arms   Neck pain    Radiculopathy, lumbar region    Ringing in ears    Sagittal plane imbalance    Scoliosis of thoracolumbar spine    Idiopathic    Patient Active Problem List   Diagnosis Date Noted   Bilateral hand pain 11/12/2020   Chondrocalcinosis 11/12/2020   Positive ANA (antinuclear antibody) 11/12/2020   Vitamin D deficiency 11/12/2020   Sacroiliitis ([place]) 01/15/2019   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   Lumbar scoliosis 09/04/2018   Suicidal ideation 06/20/2016   Cocaine abuse ([place]) 06/20/2016   Chest pain 06/19/2016   OTITIS MEDIA, LEFT 11/10/2010   NECK PAIN, CHRONIC 02/15/2010   OSTEOPOROSIS  11/30/2009   BLOOD CHEMISTRY, ABNORMAL 05/07/2009   COLONIC POLYPS, ADENOMATOUS 05/01/2009   DIVERTICULOSIS OF COLON 0000000   HELICOBACTER PYLORI INFECTION 03/20/2009   CONSTIPATION 03/20/2009   FATIGUE 12/10/2008   HYPERLIPIDEMIA 01/31/2008   Essential hypertension, benign 01/31/2008   ALLERGIC RHINITIS 01/31/2008   DEPRESSION, CHRONIC 12/11/2007   BACK PAIN, CHRONIC 12/11/2007    Past Surgical History:  Procedure Laterality Date   BACK SURGERY     COLONOSCOPY  03/30/2009   [id number] polyp/Mild pancolonic diverticulosis/small internal hemorrhoid   NECK SURGERY     Per patient    OB History   No obstetric history on file.      Home Medications    Prior to Admission medications   Medication Sig Start Date End Date Taking? Authorizing Provider  benzonatate (TESSALON) 100 MG capsule Take 1 capsule (100 mg total) by mouth 3 (three) times daily as needed for cough. Do not take with alcohol or while driving or operating heavy machinery.  May cause drowsiness. 01/11/23  Yes Eulogio Bear, NP  acetaminophen (TYLENOL) 500 MG tablet Take 2 tablets (1,000 mg total) by mouth every 6 (six) hours as needed. 06/04/22   Tedd Sias, PA  amLODipine (NORVASC) 5 MG tablet Take 5 mg by  mouth daily.    [provider]  benazepril (LOTENSIN) 40 MG tablet Take by mouth daily at 6 (six) AM.    [provider]  Cholecalciferol (VITAMIN D) 50 MCG (2000 UT) tablet Take 2,000 Units by mouth in the morning and at bedtime.    [provider]  cyclobenzaprine (FLEXERIL) 10 MG tablet Take 1 tablet (10 mg total) by mouth 2 (two) times daily as needed for muscle spasms. Patient taking differently: Take 10 mg by mouth as needed for muscle spasms. 05/29/21   Mesner, Corene Cornea, MD  diclofenac Sodium (VOLTAREN) 1 % GEL Apply 4 g topically 4 (four) times daily. 06/04/22   Tedd Sias, PA  hydrochlorothiazide (MICROZIDE) 12.5 MG capsule daily at 6 (six) AM. 07/02/19   [provider]    Family History Family History  Problem Relation Age of Onset   Kidney failure Mother    Stroke Father    Stroke Sister    COPD Brother    Heart disease Brother    COPD Sister    Alcohol abuse Brother    Alcohol abuse Brother    Scoliosis Brother    Schizophrenia Brother    Heart attack Sister    Heart disease Sister     Social History Social History   Tobacco Use   Smoking status: Former    Packs/day: 0.05    Types: Cigarettes   Smokeless tobacco: Never   Tobacco comments:    1 cigarette/day  Vaping Use   Vaping Use: Never used  Substance Use Topics   Alcohol use: No   Drug use: No     Allergies   Aspirin, Atorvastatin, and Sulfonamide derivatives   Review of Systems Per HPI  Physical Exam Triage Vital Signs ED Triage Vitals  Enc Vitals Group     BP 01/11/23 1324 (!) 140/62     Pulse Rate 01/11/23 1322 (!) 109     Resp 01/11/23 1322 20     Temp 01/11/23 1322 97.8 F (36.6 C)     Temp Source 01/11/23 1322 Oral     SpO2 01/11/23 1324 94 %     Weight --      Height --      Head Circumference --      Peak Flow --      Pain Score 01/11/23 1323 9     Pain Loc --      Pain Edu? --      Excl. in [place]? --    No data found.  Updated Vital Signs BP (!) 140/62 (BP Location: Right Arm)   Pulse 61   Temp 98.4 F (36.9 C) (Oral)   Resp 20   LMP  (LMP Unknown)   SpO2 94%   Visual Acuity Right Eye Distance:   Left Eye Distance:   Bilateral Distance:    Right Eye Near:   Left Eye Near:    Bilateral Near:     Physical Exam Vitals and nursing note reviewed.  Constitutional:      General: She is not in acute distress.    Appearance: Normal appearance. She is not ill-appearing or toxic-appearing.  HENT:     Head: Normocephalic and atraumatic.     Right Ear: Tympanic membrane, ear canal and external ear normal.     Left Ear: Tympanic membrane, ear canal and external ear normal.     Nose: Congestion present. No rhinorrhea.      Mouth/Throat:     Mouth: Mucous  membranes are moist.     Pharynx: Oropharynx is clear. Posterior oropharyngeal erythema present. No oropharyngeal exudate.  Eyes:     General: No scleral icterus.    Extraocular Movements: Extraocular movements intact.  Cardiovascular:     Rate and Rhythm: Normal rate and regular rhythm.  Pulmonary:     Effort: Pulmonary effort is normal. No respiratory distress.     Breath sounds: Normal breath sounds. No wheezing, rhonchi or rales.  Abdominal:     General: Abdomen is flat. Bowel sounds are normal. There is no distension.     Palpations: Abdomen is soft.     Tenderness: There is no abdominal tenderness.  Musculoskeletal:     Cervical back: Normal range of motion and neck supple.  Lymphadenopathy:     Cervical: Cervical adenopathy present.  Skin:    General: Skin is warm and dry.     Coloration: Skin is not jaundiced or pale.     Findings: No erythema or rash.  Neurological:     Mental Status: She is alert and oriented to person, place, and time.  Psychiatric:        Behavior: Behavior is cooperative.      UC Treatments / Results  Labs (all labs ordered are listed, but only abnormal results are displayed) Labs Reviewed  SARS CORONAVIRUS 2 (TAT 6-24 HRS)    EKG   Radiology No results found.  Procedures Procedures (including critical care time)  Medications Ordered in UC Medications - No data to display  Initial Impression / Assessment and Plan / UC Course  I have reviewed the triage vital signs and the nursing notes.  Pertinent labs & imaging results that were available during my care of the patient were reviewed by me and considered in my medical decision making (see chart for details).   Patient is well-appearing, normotensive, afebrile, not tachycardic, not tachypneic, oxygenating well on room air.    1. Encounter for screening for COVID-19 2. Viral URI with cough 3. Exposure to COVID-19 virus Symptoms are consistent with  COVID-19 Vital signs and examination today are reassuring Supportive care discussed Start Tessalon Perles as needed for dry cough Patient is a candidate for molnupiravir if she test positive ER and return precautions discussed with patient as well as isolation guidelines  The patient was given the opportunity to ask questions.  All questions answered to their satisfaction.  The patient is in agreement to this plan.   Final Clinical Impressions(s) / UC Diagnoses   Final diagnoses:  Encounter for screening for COVID-19  Viral URI with cough  Exposure to COVID-19 virus     Discharge Instructions      You have a viral upper respiratory infection.  Symptoms should improve over the next week to 10 days.  If you develop chest pain or shortness of breath, go to the emergency room.  We have tested you today for COVID-19.  You will see the results in Mychart and we will call you with positive results.  Please stay home and isolate until you are aware of the results.    Some things that can make you feel better are: - Increased rest - Increasing fluid with water/sugar free electrolytes - Acetaminophen and ibuprofen as needed for fever/pain - Salt water gargling, chloraseptic spray and throat lozenges for sore throat - OTC guaifenesin (Mucinex) 600 mg twice daily for congestion - Saline sinus flushes or a neti pot - Humidifying the air -Tessalon Perles during the day as needed for  dry cough    ED Prescriptions     Medication Sig Dispense Auth. Provider   benzonatate (TESSALON) 100 MG capsule Take 1 capsule (100 mg total) by mouth 3 (three) times daily as needed for cough. Do not take with alcohol or while driving or operating heavy machinery.  May cause drowsiness. 21 capsule Eulogio Bear, NP      PDMP not reviewed this encounter.   Eulogio Bear, NP 01/11/23 [phone]

## 2023-01-12 LAB — SARS CORONAVIRUS 2 (TAT 6-24 HRS): SARS Coronavirus 2: NEGATIVE

## 2023-06-13 ENCOUNTER — Ambulatory Visit (INDEPENDENT_AMBULATORY_CARE_PROVIDER_SITE_OTHER): Payer: 59

## 2023-06-13 ENCOUNTER — Ambulatory Visit
Admission: EM | Admit: 2023-06-13 | Discharge: 2023-06-13 | Disposition: A | Discharge status: Home / Self Care | Payer: 59 | Attending: Nurse Practitioner | Admitting: Nurse Practitioner

## 2023-06-13 DIAGNOSIS — S93401A Sprain of unspecified ligament of right ankle, initial encounter: Secondary | ICD-10-CM

## 2023-06-13 DIAGNOSIS — M7989 Other specified soft tissue disorders: Secondary | ICD-10-CM | POA: Diagnosis not present

## 2023-06-13 DIAGNOSIS — M25571 Pain in right ankle and joints of right foot: Secondary | ICD-10-CM | POA: Diagnosis not present

## 2023-06-13 MED ORDER — ACETAMINOPHEN 325 MG PO TABS
975.0000 mg | ORAL_TABLET | Freq: Once | ORAL | Status: AC
  Administered 2023-06-13: 975 mg via ORAL

## 2023-06-13 NOTE — ED Triage Notes (Signed)
Pt states she tripped and twisted her right ankle 3 days ago.  States she has been using crutches at home to ambulate.  Has been using heat and ice as well.

## 2023-06-13 NOTE — Discharge Instructions (Signed)
The ankle x-ray today does not show any broken bones.  You have a sprained ankle.  Please wear the Ace wrap during the day.  Keep your leg elevated and apply ice 15 minutes on, 45 minutes off every hour while awake.  You can use the crutches while you are in severe pain.  You can take Tylenol 500 to 1000 mg every 6 hours as needed for pain.  After the pain improves over the next week, recommend starting the ankle sprain rehabilitation exercises that are provided in this packet.  Follow-up with PCP or Ortho with no improvement in symptoms despite treatment.

## 2023-06-13 NOTE — ED Notes (Addendum)
Pt has declined crutches and is requesting a wheel chair. Provider notified

## 2023-06-14 NOTE — ED Provider Notes (Signed)
[facility]    CSN: [id number] Arrival date & time: 06/13/23  1245      History   Chief Complaint Chief Complaint  Patient presents with   Ankle Pain    HPI Lori Singh is a 67 y.o. female.   Patient presents today for 3-day history of right ankle pain.  Reports initially when ankle pain began, she tripped and twisted her right ankle inward.  Reports pain on the outside of the ankle is very sensitive to touch.  She has been unable to bear weight has been using crutches at home to ambulate.  Has been using heat and ice to help with the pain.  She denies history of surgeries in the ankle.  No numbness or tingling in the toes.  Reports pain with any movement or weightbearing.    Past Medical History:  Diagnosis Date   Allergies    inhalants   Arm pain    Arm weakness    Arthritis    Back pain    Chronic, Bilateral   Balance disorder    DDD (degenerative disc disease), lumbar    Foot swelling    Gastritis    Hand swelling    Heart murmur    Hyperlipidemia    Hypertension    Joint pain    Joint swelling    Leg pain    Leg weakness    Loss of coordination    Arms   Neck pain    Radiculopathy, lumbar region    Ringing in ears    Sagittal plane imbalance    Scoliosis of thoracolumbar spine    Idiopathic    Patient Active Problem List   Diagnosis Date Noted   Bilateral hand pain 11/12/2020   Chondrocalcinosis 11/12/2020   Positive ANA (antinuclear antibody) 11/12/2020   Vitamin D deficiency 11/12/2020   Sacroiliitis (HCC) 01/15/2019   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   Lumbar scoliosis 09/04/2018   Suicidal ideation 06/20/2016   Cocaine abuse (HCC) 06/20/2016   Chest pain 06/19/2016   OTITIS MEDIA, LEFT 11/10/2010   NECK PAIN, CHRONIC 02/15/2010   OSTEOPOROSIS 11/30/2009   BLOOD CHEMISTRY, ABNORMAL 05/07/2009   COLONIC POLYPS, ADENOMATOUS 05/01/2009   DIVERTICULOSIS OF COLON 05/01/2009   HELICOBACTER PYLORI INFECTION 03/20/2009    CONSTIPATION 03/20/2009   FATIGUE 12/10/2008   HYPERLIPIDEMIA 01/31/2008   Essential hypertension, benign 01/31/2008   ALLERGIC RHINITIS 01/31/2008   DEPRESSION, CHRONIC 12/11/2007   BACK PAIN, CHRONIC 12/11/2007    Past Surgical History:  Procedure Laterality Date   BACK SURGERY     COLONOSCOPY  03/30/2009   WUJ:WJXBJ polyp/Mild pancolonic diverticulosis/small internal hemorrhoid   NECK SURGERY     Per patient    OB History   No obstetric history on file.      Home Medications    Prior to Admission medications   Medication Sig Start Date End Date Taking? Authorizing Provider  acetaminophen (TYLENOL) 500 MG tablet Take 2 tablets (1,000 mg total) by mouth every 6 (six) hours as needed. 06/04/22   Gailen Shelter, PA  amLODipine (NORVASC) 5 MG tablet Take 5 mg by mouth daily.    [provider]  benazepril (LOTENSIN) 40 MG tablet Take by mouth daily at 6 (six) AM.    [provider]  benzonatate (TESSALON) 100 MG capsule Take 1 capsule (100 mg total) by mouth 3 (three) times daily as needed for cough. Do not take with alcohol or while driving or operating heavy machinery.  May cause drowsiness. 01/11/23   Valentino Nose, NP  Cholecalciferol (VITAMIN D) 50 MCG (2000 UT) tablet Take 2,000 Units by mouth in the morning and at bedtime.    [provider]  cyclobenzaprine (FLEXERIL) 10 MG tablet Take 1 tablet (10 mg total) by mouth 2 (two) times daily as needed for muscle spasms. Patient taking differently: Take 10 mg by mouth as needed for muscle spasms. 05/29/21   Mesner, Barbara Cower, MD  diclofenac Sodium (VOLTAREN) 1 % GEL Apply 4 g topically 4 (four) times daily. 06/04/22   Gailen Shelter, PA  hydrochlorothiazide (MICROZIDE) 12.5 MG capsule daily at 6 (six) AM. 07/02/19   [provider]    Family History Family History  Problem Relation Age of Onset   Kidney failure Mother    Stroke Father    Stroke Sister    COPD Brother    Heart disease  Brother    COPD Sister    Alcohol abuse Brother    Alcohol abuse Brother    Scoliosis Brother    Schizophrenia Brother    Heart attack Sister    Heart disease Sister     Social History Social History   Tobacco Use   Smoking status: Former    Current packs/day: 0.05    Types: Cigarettes   Smokeless tobacco: Never   Tobacco comments:    1 cigarette/day  Vaping Use   Vaping status: Never Used  Substance Use Topics   Alcohol use: No   Drug use: No     Allergies   Aspirin, Atorvastatin, and Sulfonamide derivatives   Review of Systems Review of Systems Per HPI  Physical Exam Triage Vital Signs ED Triage Vitals  Encounter Vitals Group     BP 06/13/23 1335 (!) 174/62     Systolic BP Percentile --      Diastolic BP Percentile --      Pulse Rate 06/13/23 1335 76     Resp 06/13/23 1335 16     Temp 06/13/23 1335 98.2 F (36.8 C)     Temp Source 06/13/23 1335 Oral     SpO2 06/13/23 1335 97 %     Weight --      Height --      Head Circumference --      Peak Flow --      Pain Score 06/13/23 1336 8     Pain Loc --      Pain Education --      Exclude from Growth Chart --    No data found.  Updated Vital Signs BP (!) 174/62 (BP Location: Left Arm)   Pulse 76   Temp 98.2 F (36.8 C) (Oral)   Resp 16   LMP  (LMP Unknown)   SpO2 97%   Visual Acuity Right Eye Distance:   Left Eye Distance:   Bilateral Distance:    Right Eye Near:   Left Eye Near:    Bilateral Near:     Physical Exam Vitals and nursing note reviewed.  Constitutional:      General: She is not in acute distress.    Appearance: Normal appearance. She is not toxic-appearing.  HENT:     Mouth/Throat:     Mouth: Mucous membranes are moist.     Pharynx: Oropharynx is clear.  Pulmonary:     Effort: Pulmonary effort is normal. No respiratory distress.  Musculoskeletal:     Comments: Inspection: mild swelling to the lateral right malleolus; no bruising, obvious deformity  or redness Palpation:  Exquisitely tender to palpation to the right lateral malleolus; no obvious deformities palpated ROM: Difficult to assess secondary to pain Strength: Difficult to assess secondary to pain Neurovascular: neurovascularly intact in right and left lower extremities   Skin:    General: Skin is warm and dry.     Capillary Refill: Capillary refill takes less than 2 seconds.     Coloration: Skin is not jaundiced or pale.     Findings: No erythema.  Neurological:     Mental Status: She is alert and oriented to person, place, and time.  Psychiatric:        Behavior: Behavior is cooperative.      UC Treatments / Results  Labs (all labs ordered are listed, but only abnormal results are displayed) Labs Reviewed - No data to display  EKG   Radiology DG Ankle Complete Right  Result Date: 06/13/2023 CLINICAL DATA:  Pain EXAM: RIGHT ANKLE - COMPLETE 3+ VIEW COMPARISON:  None Available. FINDINGS: No recent fracture or dislocation is seen. Small faint linear calcifications are noted adjacent to the inferior margin of lateral malleolus. Small bony spur is noted at the tip of medial malleolus. There is a large bony spur in the dorsal aspect of anterior talus. There is soft tissue swelling over the anterior and medial aspects. IMPRESSION: No recent fracture or dislocation is seen. Faint linear calcifications noted between the lateral malleolus and talus may suggest ligament calcification from previous injury or chronic inflammation. Electronically Signed   By: Ernie Avena M.D.   On: 06/13/2023 14:33    Procedures Procedures (including critical care time)  Medications Ordered in UC Medications  acetaminophen (TYLENOL) tablet 975 mg (975 mg Oral Given 06/13/23 1450)    Initial Impression / Assessment and Plan / UC Course  I have reviewed the triage vital signs and the nursing notes.  Pertinent labs & imaging results that were available during my care of the patient were reviewed by me and  considered in my medical decision making (see chart for details).   Patient is well-appearing, normotensive, afebrile, not tachycardic, not tachypneic, oxygenating well on room air.    1. Sprain of right ankle, unspecified ligament, initial encounter X-ray imaging today is negative for acute bony abnormality Tylenol given in urgent care today for pain Recommended rest, ice, compression, elevation Ace wrap applied today I offered crutches, however patient declined Recommended follow-up with Ortho with no improvement or worsening symptoms despite treatment   .The patient was given the opportunity to ask questions.  All questions answered to their satisfaction.  The patient is in agreement to this plan.    Final Clinical Impressions(s) / UC Diagnoses   Final diagnoses:  Sprain of right ankle, unspecified ligament, initial encounter     Discharge Instructions      The ankle x-ray today does not show any broken bones.  You have a sprained ankle.  Please wear the Ace wrap during the day.  Keep your leg elevated and apply ice 15 minutes on, 45 minutes off every hour while awake.  You can use the crutches while you are in severe pain.  You can take Tylenol 500 to 1000 mg every 6 hours as needed for pain.  After the pain improves over the next week, recommend starting the ankle sprain rehabilitation exercises that are provided in this packet.  Follow-up with PCP or Ortho with no improvement in symptoms despite treatment.    ED Prescriptions   None  PDMP not reviewed this encounter.   Valentino Nose, NP 06/14/23 [phone]

## 2023-09-12 DIAGNOSIS — Z79899 Other long term (current) drug therapy: Secondary | ICD-10-CM | POA: Diagnosis not present

## 2023-09-12 DIAGNOSIS — I10 Essential (primary) hypertension: Secondary | ICD-10-CM | POA: Diagnosis not present

## 2023-09-12 DIAGNOSIS — N1831 Chronic kidney disease, stage 3a: Secondary | ICD-10-CM | POA: Diagnosis not present

## 2023-09-12 DIAGNOSIS — E559 Vitamin D deficiency, unspecified: Secondary | ICD-10-CM | POA: Diagnosis not present

## 2023-09-12 DIAGNOSIS — Z713 Dietary counseling and surveillance: Secondary | ICD-10-CM | POA: Diagnosis not present

## 2023-09-12 DIAGNOSIS — Z681 Body mass index (BMI) 19 or less, adult: Secondary | ICD-10-CM | POA: Diagnosis not present

## 2023-09-14 ENCOUNTER — Encounter: Payer: Self-pay | Admitting: *Deleted

## 2023-11-09 DIAGNOSIS — I10 Essential (primary) hypertension: Secondary | ICD-10-CM | POA: Diagnosis not present

## 2023-11-09 DIAGNOSIS — J069 Acute upper respiratory infection, unspecified: Secondary | ICD-10-CM | POA: Diagnosis not present

## 2023-11-09 DIAGNOSIS — E785 Hyperlipidemia, unspecified: Secondary | ICD-10-CM | POA: Diagnosis not present

## 2023-11-09 DIAGNOSIS — M2550 Pain in unspecified joint: Secondary | ICD-10-CM | POA: Diagnosis not present

## 2023-11-09 DIAGNOSIS — F17200 Nicotine dependence, unspecified, uncomplicated: Secondary | ICD-10-CM | POA: Diagnosis not present

## 2024-03-14 ENCOUNTER — Encounter: Payer: Self-pay | Admitting: *Deleted

## 2024-05-09 DIAGNOSIS — Z79899 Other long term (current) drug therapy: Secondary | ICD-10-CM | POA: Diagnosis not present

## 2024-05-09 DIAGNOSIS — Z789 Other specified health status: Secondary | ICD-10-CM | POA: Diagnosis not present

## 2024-05-09 DIAGNOSIS — J329 Chronic sinusitis, unspecified: Secondary | ICD-10-CM | POA: Diagnosis not present

## 2024-05-09 DIAGNOSIS — Z91199 Patient's noncompliance with other medical treatment and regimen due to unspecified reason: Secondary | ICD-10-CM | POA: Diagnosis not present

## 2024-05-09 DIAGNOSIS — E785 Hyperlipidemia, unspecified: Secondary | ICD-10-CM | POA: Diagnosis not present

## 2024-05-09 DIAGNOSIS — I10 Essential (primary) hypertension: Secondary | ICD-10-CM | POA: Diagnosis not present

## 2024-05-09 DIAGNOSIS — Z681 Body mass index (BMI) 19 or less, adult: Secondary | ICD-10-CM | POA: Diagnosis not present

## 2024-06-07 DIAGNOSIS — M542 Cervicalgia: Secondary | ICD-10-CM | POA: Diagnosis not present

## 2024-06-07 DIAGNOSIS — M546 Pain in thoracic spine: Secondary | ICD-10-CM | POA: Diagnosis not present

## 2024-06-07 DIAGNOSIS — M5440 Lumbago with sciatica, unspecified side: Secondary | ICD-10-CM | POA: Diagnosis not present

## 2024-06-07 DIAGNOSIS — I10 Essential (primary) hypertension: Secondary | ICD-10-CM | POA: Diagnosis not present

## 2024-06-07 DIAGNOSIS — G8929 Other chronic pain: Secondary | ICD-10-CM | POA: Diagnosis not present

## 2024-06-07 DIAGNOSIS — M25511 Pain in right shoulder: Secondary | ICD-10-CM | POA: Diagnosis not present

## 2024-09-16 ENCOUNTER — Other Ambulatory Visit: Payer: Self-pay

## 2024-09-16 ENCOUNTER — Emergency Department (HOSPITAL_COMMUNITY)

## 2024-09-16 ENCOUNTER — Emergency Department (HOSPITAL_COMMUNITY)
Admission: EM | Admit: 2024-09-16 | Discharge: 2024-09-16 | Disposition: A | Discharge status: Home / Self Care | Attending: Emergency Medicine | Admitting: Emergency Medicine

## 2024-09-16 ENCOUNTER — Encounter (HOSPITAL_COMMUNITY): Payer: Self-pay

## 2024-09-16 DIAGNOSIS — J181 Lobar pneumonia, unspecified organism: Secondary | ICD-10-CM | POA: Diagnosis not present

## 2024-09-16 DIAGNOSIS — H9201 Otalgia, right ear: Secondary | ICD-10-CM | POA: Insufficient documentation

## 2024-09-16 DIAGNOSIS — R059 Cough, unspecified: Secondary | ICD-10-CM | POA: Diagnosis present

## 2024-09-16 DIAGNOSIS — J189 Pneumonia, unspecified organism: Secondary | ICD-10-CM

## 2024-09-16 LAB — RESP PANEL BY RT-PCR (RSV, FLU A&B, COVID)  RVPGX2
Influenza A by PCR: NEGATIVE
Influenza B by PCR: NEGATIVE
Resp Syncytial Virus by PCR: NEGATIVE
SARS Coronavirus 2 by RT PCR: NEGATIVE

## 2024-09-16 LAB — CBC WITH DIFFERENTIAL/PLATELET
Abs Immature Granulocytes: 0.02 K/uL (ref 0.00–0.07)
Basophils Absolute: 0.1 K/uL (ref 0.0–0.1)
Basophils Relative: 1 %
Eosinophils Absolute: 0.2 K/uL (ref 0.0–0.5)
Eosinophils Relative: 3 %
HCT: 43.9 % (ref 36.0–46.0)
Hemoglobin: 13.8 g/dL (ref 12.0–15.0)
Immature Granulocytes: 0 %
Lymphocytes Relative: 45 %
Lymphs Abs: 3.2 K/uL (ref 0.7–4.0)
MCH: 30.3 pg (ref 26.0–34.0)
MCHC: 31.4 g/dL (ref 30.0–36.0)
MCV: 96.5 fL (ref 80.0–100.0)
Monocytes Absolute: 0.5 K/uL (ref 0.1–1.0)
Monocytes Relative: 7 %
Neutro Abs: 3 K/uL (ref 1.7–7.7)
Neutrophils Relative %: 44 %
Platelets: 362 K/uL (ref 150–400)
RBC: 4.55 MIL/uL (ref 3.87–5.11)
RDW: 14 % (ref 11.5–15.5)
WBC: 6.9 K/uL (ref 4.0–10.5)
nRBC: 0 % (ref 0.0–0.2)

## 2024-09-16 LAB — BASIC METABOLIC PANEL WITH GFR
Anion gap: 9 (ref 5–15)
BUN: 17 mg/dL (ref 8–23)
CO2: 26 mmol/L (ref 22–32)
Calcium: 9.1 mg/dL (ref 8.9–10.3)
Chloride: 104 mmol/L (ref 98–111)
Creatinine, Ser: 0.83 mg/dL (ref 0.44–1.00)
GFR, Estimated: >60 mL/min (ref >60)
Glucose, Bld: 113 mg/dL — ABNORMAL HIGH (ref 70–99)
Potassium: 4.3 mmol/L (ref 3.5–5.1)
Sodium: 139 mmol/L (ref 135–145)

## 2024-09-16 LAB — GROUP A STREP BY PCR: Group A Strep by PCR: NOT DETECTED

## 2024-09-16 MED ORDER — SODIUM CHLORIDE 0.9 % IV SOLN
1.0000 g | Freq: Once | INTRAVENOUS | Status: AC
  Administered 2024-09-16: 1 g via INTRAVENOUS
  Filled 2024-09-16: qty 10

## 2024-09-16 MED ORDER — BENZONATATE 100 MG PO CAPS: 100.0000 mg | ORAL_CAPSULE | Freq: Three times a day (TID) | ORAL | 0 refills | Status: DC

## 2024-09-16 MED ORDER — DOXYCYCLINE HYCLATE 100 MG PO TABS
100.0000 mg | ORAL_TABLET | Freq: Once | ORAL | Status: AC
  Administered 2024-09-16: 100 mg via ORAL
  Filled 2024-09-16: qty 1

## 2024-09-16 MED ORDER — CEFDINIR 300 MG PO CAPS: 300.0000 mg | ORAL_CAPSULE | Freq: Two times a day (BID) | ORAL | 0 refills | Status: AC

## 2024-09-16 MED ORDER — DEXAMETHASONE SOD PHOSPHATE PF 10 MG/ML IJ SOLN
10.0000 mg | Freq: Once | INTRAMUSCULAR | Status: AC
  Administered 2024-09-16: 10 mg via INTRAVENOUS

## 2024-09-16 MED ORDER — SODIUM CHLORIDE 0.9 % IV BOLUS
1000.0000 mL | Freq: Once | INTRAVENOUS | Status: AC
  Administered 2024-09-16: 1000 mL via INTRAVENOUS

## 2024-09-16 MED ORDER — KETOROLAC TROMETHAMINE 15 MG/ML IJ SOLN
15.0000 mg | Freq: Once | INTRAMUSCULAR | Status: AC
  Administered 2024-09-16: 15 mg via INTRAVENOUS
  Filled 2024-09-16: qty 1

## 2024-09-16 MED ORDER — DOXYCYCLINE HYCLATE 100 MG PO CAPS: 100.0000 mg | ORAL_CAPSULE | Freq: Two times a day (BID) | ORAL | 0 refills | Status: AC

## 2024-09-16 NOTE — Discharge Instructions (Signed)
 Please take all antibiotics as directed.  Please start taking the antibiotics tomorrow as you have received which you need today.  Follow-up closely with your primary care doctor on an outpatient basis.  Return to emergency department immediately for any new or worsening symptoms.

## 2024-09-16 NOTE — ED Provider Notes (Signed)
 [facility] EMERGENCY DEPARTMENT AT [facility] Provider Note   CSN: [id number] Arrival date & time: 09/16/24  1025     Patient presents with: Nasal Congestion   Lori Singh is a 68 y.o. female.   Patient is a 68 year old female who presents emergency department chief plaint of cough, congestion, fatigue, generalized bodyaches, right-sided ear pain, sore throat which has been ongoing for approximate the past 3 days.  She has been exposed to her grandchild who was sick with similar symptoms.  She has been taking Mucinex with only minimal improvement in her symptoms.  The cough has been productive in nature.  She denies any associated abdominal pain, dysuria, hematuria.  She has had some associated nausea, vomiting, diarrhea.        Prior to Admission medications   Medication Sig Start Date End Date Taking? Authorizing Provider  acetaminophen  (TYLENOL ) 500 MG tablet Take 2 tablets (1,000 mg total) by mouth every 6 (six) hours as needed. 06/04/22   Neldon Hamp RAMAN, PA  amLODipine  (NORVASC ) 5 MG tablet Take 5 mg by mouth daily.    [provider]  benazepril (LOTENSIN) 40 MG tablet Take by mouth daily at 6 (six) AM.    [provider]  benzonatate  (TESSALON ) 100 MG capsule Take 1 capsule (100 mg total) by mouth 3 (three) times daily as needed for cough. Do not take with alcohol or while driving or operating heavy machinery.  May cause drowsiness. 01/11/23   Chandra Harlene LABOR, NP  Cholecalciferol (VITAMIN D) 50 MCG (2000 UT) tablet Take 2,000 Units by mouth in the morning and at bedtime.    [provider]  cyclobenzaprine  (FLEXERIL ) 10 MG tablet Take 1 tablet (10 mg total) by mouth 2 (two) times daily as needed for muscle spasms. Patient taking differently: Take 10 mg by mouth as needed for muscle spasms. 05/29/21   Mesner, Selinda, MD  diclofenac  Sodium (VOLTAREN ) 1 % GEL Apply 4 g topically 4 (four) times daily. 06/04/22   Neldon Hamp RAMAN, PA   hydrochlorothiazide (MICROZIDE) 12.5 MG capsule daily at 6 (six) AM. 07/02/19   [provider]    Allergies: Aspirin, Atorvastatin, and Sulfonamide derivatives    Review of Systems  HENT:  Positive for ear pain, rhinorrhea and sore throat.   Respiratory:  Positive for cough.   All other systems reviewed and are negative.   Updated Vital Signs BP (!) 141/71 (BP Location: Right Arm)   Pulse 78   Temp 98.8 F (37.1 C) (Oral)   Resp 16   Ht 5' 2.5 (1.588 m)   Wt 49.9 kg   LMP  (LMP Unknown)   SpO2 100%   BMI 19.80 kg/m   Physical Exam Vitals and nursing note reviewed.  Constitutional:      General: She is not in acute distress.    Appearance: Normal appearance. She is not ill-appearing.  HENT:     Head: Normocephalic and atraumatic.     Ears:     Comments: Right TM with mild bulging, minimal erythema, bilateral canals unremarkable, left TM unremarkable    Nose: Congestion and rhinorrhea present.     Mouth/Throat:     Mouth: Mucous membranes are moist.     Pharynx: No oropharyngeal exudate or posterior oropharyngeal erythema.  Eyes:     Extraocular Movements: Extraocular movements intact.     Conjunctiva/sclera: Conjunctivae normal.     Pupils: Pupils are equal, round, and reactive to light.  Cardiovascular:  Rate and Rhythm: Normal rate and regular rhythm.     Pulses: Normal pulses.     Heart sounds: Normal heart sounds. No murmur heard.    No gallop.  Pulmonary:     Effort: Pulmonary effort is normal. No respiratory distress.     Breath sounds: Normal breath sounds. No stridor. No wheezing, rhonchi or rales.  Abdominal:     General: Abdomen is flat. Bowel sounds are normal. There is no distension.     Palpations: Abdomen is soft.     Tenderness: There is no abdominal tenderness. There is no guarding.  Musculoskeletal:        General: Normal range of motion.     Cervical back: Normal range of motion and neck supple. No rigidity or tenderness.      Right lower leg: No edema.     Left lower leg: No edema.  Skin:    General: Skin is warm and dry.     Findings: No rash.  Neurological:     General: No focal deficit present.     Mental Status: She is alert and oriented to person, place, and time. Mental status is at baseline.     Cranial Nerves: No cranial nerve deficit.     Sensory: No sensory deficit.     Motor: No weakness.     Coordination: Coordination normal.     Gait: Gait normal.  Psychiatric:        Mood and Affect: Mood normal.        Behavior: Behavior normal.        Thought Content: Thought content normal.        Judgment: Judgment normal.     (all labs ordered are listed, but only abnormal results are displayed) Labs Reviewed  BASIC METABOLIC PANEL WITH GFR - Abnormal; Notable for the following components:      Result Value   Glucose, Bld 113 (*)    All other components within normal limits  RESP PANEL BY RT-PCR (RSV, FLU A&B, COVID)  RVPGX2  GROUP A STREP BY PCR  CBC WITH DIFFERENTIAL/PLATELET    EKG: None  Radiology: DG Chest 2 View Result Date: 09/16/2024 CLINICAL DATA:  Cough and shortness of breath EXAM: CHEST - 2 VIEW COMPARISON:  Chest radiograph dated 06/19/2016 FINDINGS: Normal lung volumes. Patchy right lower lung opacity. No pleural effusion or pneumothorax. The heart size and mediastinal contours are within normal limits. No acute osseous abnormality. Unchanged upper thoracic dextroscoliosis status post upper thoracic spinal fusion. Hardware appears intact. IMPRESSION: Patchy right lower lung opacity, which may represent atelectasis, aspiration, or pneumonia. Electronically Signed   By: Limin  Xu M.D.   On: 09/16/2024 11:26     Procedures   Medications Ordered in the ED  sodium chloride 0.9 % bolus 1,000 mL (has no administration in time range)  ketorolac  (TORADOL ) 15 MG/ML injection 15 mg (has no administration in time range)  dexamethasone (DECADRON) injection 10 mg (has no administration in  time range)  cefTRIAXone (ROCEPHIN) 1 g in sodium chloride 0.9 % 100 mL IVPB (has no administration in time range)  doxycycline (VIBRA-TABS) tablet 100 mg (has no administration in time range)                                    Medical Decision Making Amount and/or Complexity of Data Reviewed Labs: ordered. Radiology: ordered.  Risk Prescription drug management.  This patient presents to the ED for concern of cough, congestion, generalized malaise and fatigue, right ear pain differential diagnosis includes pneumonia, acute viral syndrome, strep pharyngitis, otitis media, otitis externa, perichondritis, mastoiditis    Additional history obtained:  Additional history obtained from none External records from outside source obtained and reviewed including none   Lab Tests:  I Ordered, and personally interpreted labs.  The pertinent results include: No leukocytosis, no anemia, normal kidney function, unremarkable electrolytes, negative strep test, negative respiratory panel   Imaging Studies ordered:  I ordered imaging studies including chest x-ray I independently visualized and interpreted imaging which showed right lower lobe infiltrate I agree with the radiologist interpretation   Medicines ordered and prescription drug management:  I ordered medication including Rocephin, Doxy, Decadron, Toradol , IV fluids for pneumonia Reevaluation of the patient after these medicines showed that the patient improved I have reviewed the patients home medicines and have made adjustments as needed   Problem List / ED Course:  Patient is doing very well at this time and is stable for discharge home.  Vital signs and blood work are otherwise unremarkable with no indication for sepsis.  She has no associated hypoxia.  Discussed with patient that chest x-ray is concerning for pneumonia at this time.  Will continue treatment with oral antibiotics on an outpatient basis as well as symptomatic  treatment of her cough.  Do not suspect that admission is warranted at this time.  Close follow-up with PCP was discussed as well as strict turn precautions for any new or worsening symptoms.  Patient voiced understanding to the plan and had no additional questions.   Social Determinants of Health:  None        Final diagnoses:  None    ED Discharge Orders     None          Daralene Lonni JONETTA DEVONNA 09/16/24 1317    Bernard Drivers, MD 09/18/24 1430

## 2024-09-16 NOTE — ED Triage Notes (Signed)
 Pt arrived via POV from home c/o cough, congestion, fatigue, generalized body aches, right ear pain and sore throat. Pt reports symptoms began Friday before the weekend and Mucinex has not been helping her.

## 2024-12-08 ENCOUNTER — Emergency Department (HOSPITAL_COMMUNITY)

## 2024-12-08 ENCOUNTER — Other Ambulatory Visit: Payer: Self-pay

## 2024-12-08 ENCOUNTER — Encounter (HOSPITAL_COMMUNITY): Payer: Self-pay | Admitting: Emergency Medicine

## 2024-12-08 ENCOUNTER — Emergency Department (HOSPITAL_COMMUNITY)
Admission: EM | Admit: 2024-12-08 | Discharge: 2024-12-08 | Disposition: A | Discharge status: Home / Self Care | Attending: Emergency Medicine | Admitting: Emergency Medicine

## 2024-12-08 DIAGNOSIS — M25511 Pain in right shoulder: Secondary | ICD-10-CM | POA: Insufficient documentation

## 2024-12-08 DIAGNOSIS — S01411A Laceration without foreign body of right cheek and temporomandibular area, initial encounter: Secondary | ICD-10-CM | POA: Diagnosis not present

## 2024-12-08 DIAGNOSIS — D72829 Elevated white blood cell count, unspecified: Secondary | ICD-10-CM | POA: Insufficient documentation

## 2024-12-08 DIAGNOSIS — M25551 Pain in right hip: Secondary | ICD-10-CM | POA: Diagnosis not present

## 2024-12-08 DIAGNOSIS — S0990XA Unspecified injury of head, initial encounter: Secondary | ICD-10-CM | POA: Diagnosis not present

## 2024-12-08 DIAGNOSIS — S0993XA Unspecified injury of face, initial encounter: Secondary | ICD-10-CM | POA: Diagnosis present

## 2024-12-08 LAB — CBC
HCT: 41.2 % (ref 36.0–46.0)
Hemoglobin: 13.2 g/dL (ref 12.0–15.0)
MCH: 29.8 pg (ref 26.0–34.0)
MCHC: 32 g/dL (ref 30.0–36.0)
MCV: 93 fL (ref 80.0–100.0)
Platelets: 300 K/uL (ref 150–400)
RBC: 4.43 MIL/uL (ref 3.87–5.11)
RDW: 14.6 % (ref 11.5–15.5)
WBC: 10.6 K/uL — ABNORMAL HIGH (ref 4.0–10.5)
nRBC: 0 % (ref 0.0–0.2)

## 2024-12-08 LAB — COMPREHENSIVE METABOLIC PANEL WITH GFR
ALT: 13 U/L (ref 0–44)
AST: 26 U/L (ref 15–41)
Albumin: 4.2 g/dL (ref 3.5–5.0)
Alkaline Phosphatase: 99 U/L (ref 38–126)
Anion gap: 13 (ref 5–15)
BUN: 16 mg/dL (ref 8–23)
CO2: 24 mmol/L (ref 22–32)
Calcium: 9.3 mg/dL (ref 8.9–10.3)
Chloride: 105 mmol/L (ref 98–111)
Creatinine, Ser: 0.66 mg/dL (ref 0.44–1.00)
GFR, Estimated: >60 mL/min
Glucose, Bld: 95 mg/dL (ref 70–99)
Potassium: 3.4 mmol/L — ABNORMAL LOW (ref 3.5–5.1)
Sodium: 142 mmol/L (ref 135–145)
Total Bilirubin: 0.3 mg/dL (ref 0.0–1.2)
Total Protein: 8.4 g/dL — ABNORMAL HIGH (ref 6.5–8.1)

## 2024-12-08 LAB — ETHANOL: Alcohol, Ethyl (B): <15 mg/dL

## 2024-12-08 MED ORDER — OXYCODONE-ACETAMINOPHEN 5-325 MG PO TABS
1.0000 | ORAL_TABLET | Freq: Once | ORAL | Status: AC
  Administered 2024-12-08: 1 via ORAL
  Filled 2024-12-08: qty 1

## 2024-12-08 MED ORDER — LIDOCAINE-EPINEPHRINE-TETRACAINE (LET) TOPICAL GEL
3.0000 mL | Freq: Once | TOPICAL | Status: AC
  Administered 2024-12-08: 3 mL via TOPICAL
  Filled 2024-12-08: qty 3

## 2024-12-08 MED ORDER — LIDOCAINE-EPINEPHRINE (PF) 2 %-1:200000 IJ SOLN
10.0000 mL | Freq: Once | INTRAMUSCULAR | Status: AC
  Administered 2024-12-08: 10 mL via INTRADERMAL
  Filled 2024-12-08: qty 20
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
## 2024-12-08 NOTE — ED Notes (Signed)
 ED Provider at bedside.
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
## 2024-12-08 NOTE — Discharge Instructions (Signed)
 2 stitches were placed in the right cheek wound.  These can be removed in 7-10 days.  If area becomes increasingly painful, swollen, red, warm, or begins draining pus, return to the emergency department.

## 2024-12-09 LAB — I-STAT CHEM 8, ED
BUN: 19 mg/dL (ref 8–23)
Calcium, Ion: 1.16 mmol/L (ref 1.15–1.40)
Chloride: 108 mmol/L (ref 98–111)
Creatinine, Ser: 0.7 mg/dL (ref 0.44–1.00)
Glucose, Bld: 97 mg/dL (ref 70–99)
HCT: 42 % (ref 36.0–46.0)
Hemoglobin: 14.3 g/dL (ref 12.0–15.0)
Potassium: 3.5 mmol/L (ref 3.5–5.1)
Sodium: 145 mmol/L (ref 135–145)
TCO2: 26 mmol/L (ref 22–32)

## 2024-12-24 ENCOUNTER — Other Ambulatory Visit (HOSPITAL_COMMUNITY): Payer: Self-pay | Admitting: Family Medicine

## 2024-12-24 DIAGNOSIS — Z13820 Encounter for screening for osteoporosis: Secondary | ICD-10-CM

## 2024-12-26 ENCOUNTER — Telehealth: Payer: Self-pay | Admitting: *Deleted

## 2024-12-26 NOTE — Telephone Encounter (Signed)
 Procedure: SCREENING  Height: 5'2 Weight: 110LBS        Have you had a colonoscopy before?  10 OR MORE YEARS AGO  Do you have family history of colon cancer?  NO  Do you have a family history of polyps? NO  Previous colonoscopy with polyps removed? YES  Do you have a history colorectal cancer?   NO  Are you diabetic?  NO  Do you have a prosthetic or mechanical heart valve? NO  Do you have a pacemaker/defibrillator?   NO  Have you had endocarditis/atrial fibrillation?  NO  Do you use supplemental oxygen/CPAP?  NO  Have you had joint replacement within the last 12 months?  NO  Do you tend to be constipated or have to use laxatives?  NO   Do you have history of alcohol use? If yes, how much and how often.  NO  Do you have history or are you using drugs? If yes, what do are you  using?  NO  Have you ever had a stroke/heart attack?  NO  Have you ever had a heart or other vascular stent placed,?NO  Do you take weight loss medication? NO  female patients,: have you had a hysterectomy?                               are you post menopausal?                                do you still have your menstrual cycle? NO    Date of last menstrual period?   Do you take any blood-thinning medications such as: (Plavix, aspirin, Coumadin, Aggrenox, Brilinta, Xarelto, Eliquis, Pradaxa, Savaysa or Effient)? NO  If yes we need the name, milligram, dosage and who is prescribing doctor:               Current Outpatient Medications  Medication Sig Dispense Refill   acetaminophen  (TYLENOL ) 500 MG tablet Take 2 tablets (1,000 mg total) by mouth every 6 (six) hours as needed. 30 tablet 0   amLODipine  (NORVASC ) 5 MG tablet Take 5 mg by mouth daily.     benazepril (LOTENSIN) 40 MG tablet Take by mouth daily at 6 (six) AM.     hydrochlorothiazide (MICROZIDE) 12.5 MG capsule daily at 6 (six) AM.     No current facility-administered medications for this visit.    Allergies[1]      [1]   Allergies Allergen Reactions   Aspirin     REACTION: STOMACH BLEEDS   Atorvastatin Other (See Comments)    REACTION: legs aching REACTION: legs aching   Sulfonamide Derivatives

## 2025-01-06 ENCOUNTER — Other Ambulatory Visit (HOSPITAL_COMMUNITY)
